# Patient Record
Sex: Male | Born: 1965
Health system: Southern US, Community
[De-identification: ages and names within clinical notes are randomized; demographics above are authoritative.]

## PROBLEM LIST (undated history)

## (undated) DIAGNOSIS — R001 Bradycardia, unspecified: Secondary | ICD-10-CM

## (undated) DIAGNOSIS — I10 Essential (primary) hypertension: Secondary | ICD-10-CM

## (undated) DIAGNOSIS — N200 Calculus of kidney: Secondary | ICD-10-CM

## (undated) DIAGNOSIS — M069 Rheumatoid arthritis, unspecified: Secondary | ICD-10-CM

## (undated) DIAGNOSIS — M109 Gout, unspecified: Secondary | ICD-10-CM

## (undated) DIAGNOSIS — M5030 Other cervical disc degeneration, unspecified cervical region: Secondary | ICD-10-CM

## (undated) HISTORY — DX: Gout, unspecified: M10.9

## (undated) HISTORY — DX: Other cervical disc degeneration, unspecified cervical region: M50.30

## (undated) HISTORY — DX: Essential (primary) hypertension: I10

## (undated) HISTORY — DX: Calculus of kidney: N20.0

## (undated) HISTORY — PX: HAND SURGERY: SHX662

## (undated) HISTORY — DX: Rheumatoid arthritis, unspecified: M06.9

---

## 2005-10-10 ENCOUNTER — Ambulatory Visit: Payer: Self-pay | Admitting: Cardiology

## 2012-10-09 HISTORY — PX: LITHOTRIPSY: SUR834

## 2014-02-08 DIAGNOSIS — Q619 Cystic kidney disease, unspecified: Secondary | ICD-10-CM | POA: Diagnosis not present

## 2014-02-08 DIAGNOSIS — N200 Calculus of kidney: Secondary | ICD-10-CM | POA: Diagnosis not present

## 2014-03-23 DIAGNOSIS — M069 Rheumatoid arthritis, unspecified: Secondary | ICD-10-CM | POA: Diagnosis not present

## 2014-03-23 DIAGNOSIS — M109 Gout, unspecified: Secondary | ICD-10-CM | POA: Diagnosis not present

## 2014-03-23 DIAGNOSIS — M25469 Effusion, unspecified knee: Secondary | ICD-10-CM | POA: Diagnosis not present

## 2014-04-21 DIAGNOSIS — M069 Rheumatoid arthritis, unspecified: Secondary | ICD-10-CM | POA: Diagnosis not present

## 2014-04-21 DIAGNOSIS — M25469 Effusion, unspecified knee: Secondary | ICD-10-CM | POA: Diagnosis not present

## 2014-04-21 DIAGNOSIS — M109 Gout, unspecified: Secondary | ICD-10-CM | POA: Diagnosis not present

## 2014-05-03 DIAGNOSIS — M25469 Effusion, unspecified knee: Secondary | ICD-10-CM | POA: Diagnosis not present

## 2014-05-03 DIAGNOSIS — M1710 Unilateral primary osteoarthritis, unspecified knee: Secondary | ICD-10-CM | POA: Diagnosis not present

## 2014-05-03 DIAGNOSIS — M069 Rheumatoid arthritis, unspecified: Secondary | ICD-10-CM | POA: Diagnosis not present

## 2014-05-03 DIAGNOSIS — M109 Gout, unspecified: Secondary | ICD-10-CM | POA: Diagnosis not present

## 2014-05-10 DIAGNOSIS — M1710 Unilateral primary osteoarthritis, unspecified knee: Secondary | ICD-10-CM | POA: Diagnosis not present

## 2014-05-10 DIAGNOSIS — M109 Gout, unspecified: Secondary | ICD-10-CM | POA: Diagnosis not present

## 2014-05-18 DIAGNOSIS — I10 Essential (primary) hypertension: Secondary | ICD-10-CM | POA: Diagnosis not present

## 2014-05-18 DIAGNOSIS — M1711 Unilateral primary osteoarthritis, right knee: Secondary | ICD-10-CM | POA: Diagnosis not present

## 2014-05-18 DIAGNOSIS — E780 Pure hypercholesterolemia: Secondary | ICD-10-CM | POA: Diagnosis not present

## 2014-05-24 DIAGNOSIS — Z23 Encounter for immunization: Secondary | ICD-10-CM | POA: Diagnosis not present

## 2014-05-24 DIAGNOSIS — G43909 Migraine, unspecified, not intractable, without status migrainosus: Secondary | ICD-10-CM | POA: Diagnosis not present

## 2014-05-24 DIAGNOSIS — E780 Pure hypercholesterolemia: Secondary | ICD-10-CM | POA: Diagnosis not present

## 2014-05-24 DIAGNOSIS — M1000 Idiopathic gout, unspecified site: Secondary | ICD-10-CM | POA: Diagnosis not present

## 2014-05-24 DIAGNOSIS — I10 Essential (primary) hypertension: Secondary | ICD-10-CM | POA: Diagnosis not present

## 2014-05-24 DIAGNOSIS — M0540 Rheumatoid myopathy with rheumatoid arthritis of unspecified site: Secondary | ICD-10-CM | POA: Diagnosis not present

## 2014-11-06 DIAGNOSIS — I10 Essential (primary) hypertension: Secondary | ICD-10-CM | POA: Diagnosis not present

## 2014-11-06 DIAGNOSIS — E780 Pure hypercholesterolemia: Secondary | ICD-10-CM | POA: Diagnosis not present

## 2014-11-13 DIAGNOSIS — Z1389 Encounter for screening for other disorder: Secondary | ICD-10-CM | POA: Diagnosis not present

## 2014-11-13 DIAGNOSIS — M0540 Rheumatoid myopathy with rheumatoid arthritis of unspecified site: Secondary | ICD-10-CM | POA: Diagnosis not present

## 2014-11-13 DIAGNOSIS — E780 Pure hypercholesterolemia: Secondary | ICD-10-CM | POA: Diagnosis not present

## 2014-11-13 DIAGNOSIS — I10 Essential (primary) hypertension: Secondary | ICD-10-CM | POA: Diagnosis not present

## 2014-11-13 DIAGNOSIS — G43909 Migraine, unspecified, not intractable, without status migrainosus: Secondary | ICD-10-CM | POA: Diagnosis not present

## 2014-11-13 DIAGNOSIS — M1000 Idiopathic gout, unspecified site: Secondary | ICD-10-CM | POA: Diagnosis not present

## 2015-02-22 DIAGNOSIS — Q6100 Congenital renal cyst, unspecified: Secondary | ICD-10-CM | POA: Diagnosis not present

## 2015-02-22 DIAGNOSIS — N281 Cyst of kidney, acquired: Secondary | ICD-10-CM | POA: Diagnosis not present

## 2015-02-22 DIAGNOSIS — M069 Rheumatoid arthritis, unspecified: Secondary | ICD-10-CM | POA: Diagnosis not present

## 2015-02-22 DIAGNOSIS — N200 Calculus of kidney: Secondary | ICD-10-CM | POA: Diagnosis not present

## 2015-02-22 DIAGNOSIS — I10 Essential (primary) hypertension: Secondary | ICD-10-CM | POA: Diagnosis not present

## 2015-04-24 DIAGNOSIS — I10 Essential (primary) hypertension: Secondary | ICD-10-CM | POA: Diagnosis not present

## 2015-04-24 DIAGNOSIS — M1000 Idiopathic gout, unspecified site: Secondary | ICD-10-CM | POA: Diagnosis not present

## 2015-04-24 DIAGNOSIS — M0540 Rheumatoid myopathy with rheumatoid arthritis of unspecified site: Secondary | ICD-10-CM | POA: Diagnosis not present

## 2015-05-08 DIAGNOSIS — M7989 Other specified soft tissue disorders: Secondary | ICD-10-CM | POA: Diagnosis not present

## 2015-05-08 DIAGNOSIS — M1000 Idiopathic gout, unspecified site: Secondary | ICD-10-CM | POA: Diagnosis not present

## 2015-05-08 DIAGNOSIS — M0589 Other rheumatoid arthritis with rheumatoid factor of multiple sites: Secondary | ICD-10-CM | POA: Diagnosis not present

## 2015-05-25 DIAGNOSIS — I10 Essential (primary) hypertension: Secondary | ICD-10-CM | POA: Diagnosis not present

## 2015-05-25 DIAGNOSIS — M0540 Rheumatoid myopathy with rheumatoid arthritis of unspecified site: Secondary | ICD-10-CM | POA: Diagnosis not present

## 2015-05-25 DIAGNOSIS — Z13220 Encounter for screening for lipoid disorders: Secondary | ICD-10-CM | POA: Diagnosis not present

## 2015-05-25 DIAGNOSIS — M25532 Pain in left wrist: Secondary | ICD-10-CM | POA: Diagnosis not present

## 2015-05-25 DIAGNOSIS — M1000 Idiopathic gout, unspecified site: Secondary | ICD-10-CM | POA: Diagnosis not present

## 2015-05-25 DIAGNOSIS — G43909 Migraine, unspecified, not intractable, without status migrainosus: Secondary | ICD-10-CM | POA: Diagnosis not present

## 2015-10-25 DIAGNOSIS — M170 Bilateral primary osteoarthritis of knee: Secondary | ICD-10-CM | POA: Diagnosis not present

## 2015-11-01 DIAGNOSIS — M170 Bilateral primary osteoarthritis of knee: Secondary | ICD-10-CM | POA: Diagnosis not present

## 2015-11-08 DIAGNOSIS — M170 Bilateral primary osteoarthritis of knee: Secondary | ICD-10-CM | POA: Diagnosis not present

## 2015-11-08 DIAGNOSIS — M0540 Rheumatoid myopathy with rheumatoid arthritis of unspecified site: Secondary | ICD-10-CM | POA: Diagnosis not present

## 2015-11-08 DIAGNOSIS — I10 Essential (primary) hypertension: Secondary | ICD-10-CM | POA: Diagnosis not present

## 2015-11-08 DIAGNOSIS — M25461 Effusion, right knee: Secondary | ICD-10-CM | POA: Diagnosis not present

## 2015-11-12 DIAGNOSIS — Z13220 Encounter for screening for lipoid disorders: Secondary | ICD-10-CM | POA: Diagnosis not present

## 2015-11-12 DIAGNOSIS — I10 Essential (primary) hypertension: Secondary | ICD-10-CM | POA: Diagnosis not present

## 2015-11-12 DIAGNOSIS — M1000 Idiopathic gout, unspecified site: Secondary | ICD-10-CM | POA: Diagnosis not present

## 2015-11-12 DIAGNOSIS — M0540 Rheumatoid myopathy with rheumatoid arthritis of unspecified site: Secondary | ICD-10-CM | POA: Diagnosis not present

## 2015-11-12 DIAGNOSIS — G43909 Migraine, unspecified, not intractable, without status migrainosus: Secondary | ICD-10-CM | POA: Diagnosis not present

## 2015-12-03 DIAGNOSIS — M25462 Effusion, left knee: Secondary | ICD-10-CM | POA: Diagnosis not present

## 2015-12-03 DIAGNOSIS — M0540 Rheumatoid myopathy with rheumatoid arthritis of unspecified site: Secondary | ICD-10-CM | POA: Diagnosis not present

## 2015-12-03 DIAGNOSIS — M170 Bilateral primary osteoarthritis of knee: Secondary | ICD-10-CM | POA: Diagnosis not present

## 2015-12-12 DIAGNOSIS — S83209A Unspecified tear of unspecified meniscus, current injury, unspecified knee, initial encounter: Secondary | ICD-10-CM | POA: Diagnosis not present

## 2015-12-12 DIAGNOSIS — M25562 Pain in left knee: Secondary | ICD-10-CM | POA: Diagnosis not present

## 2015-12-12 DIAGNOSIS — M069 Rheumatoid arthritis, unspecified: Secondary | ICD-10-CM | POA: Diagnosis not present

## 2015-12-12 DIAGNOSIS — M25561 Pain in right knee: Secondary | ICD-10-CM | POA: Diagnosis not present

## 2015-12-17 DIAGNOSIS — M76892 Other specified enthesopathies of left lower limb, excluding foot: Secondary | ICD-10-CM | POA: Diagnosis not present

## 2015-12-17 DIAGNOSIS — M25561 Pain in right knee: Secondary | ICD-10-CM | POA: Diagnosis not present

## 2015-12-17 DIAGNOSIS — S83209A Unspecified tear of unspecified meniscus, current injury, unspecified knee, initial encounter: Secondary | ICD-10-CM | POA: Diagnosis not present

## 2015-12-17 DIAGNOSIS — M76891 Other specified enthesopathies of right lower limb, excluding foot: Secondary | ICD-10-CM | POA: Diagnosis not present

## 2015-12-17 DIAGNOSIS — M94261 Chondromalacia, right knee: Secondary | ICD-10-CM | POA: Diagnosis not present

## 2015-12-17 DIAGNOSIS — S76112A Strain of left quadriceps muscle, fascia and tendon, initial encounter: Secondary | ICD-10-CM | POA: Diagnosis not present

## 2015-12-20 DIAGNOSIS — M069 Rheumatoid arthritis, unspecified: Secondary | ICD-10-CM | POA: Diagnosis not present

## 2015-12-20 DIAGNOSIS — M25562 Pain in left knee: Secondary | ICD-10-CM | POA: Diagnosis not present

## 2015-12-20 DIAGNOSIS — M25561 Pain in right knee: Secondary | ICD-10-CM | POA: Diagnosis not present

## 2015-12-20 DIAGNOSIS — M1A0620 Idiopathic chronic gout, left knee, without tophus (tophi): Secondary | ICD-10-CM | POA: Diagnosis not present

## 2016-01-09 DIAGNOSIS — R197 Diarrhea, unspecified: Secondary | ICD-10-CM | POA: Diagnosis not present

## 2016-01-12 DIAGNOSIS — R197 Diarrhea, unspecified: Secondary | ICD-10-CM | POA: Diagnosis not present

## 2016-01-15 DIAGNOSIS — M7632 Iliotibial band syndrome, left leg: Secondary | ICD-10-CM | POA: Diagnosis not present

## 2016-01-15 DIAGNOSIS — M25462 Effusion, left knee: Secondary | ICD-10-CM | POA: Diagnosis not present

## 2016-01-15 DIAGNOSIS — M0540 Rheumatoid myopathy with rheumatoid arthritis of unspecified site: Secondary | ICD-10-CM | POA: Diagnosis not present

## 2016-01-16 DIAGNOSIS — A020 Salmonella enteritis: Secondary | ICD-10-CM | POA: Diagnosis not present

## 2016-01-16 DIAGNOSIS — M069 Rheumatoid arthritis, unspecified: Secondary | ICD-10-CM | POA: Diagnosis not present

## 2016-01-17 DIAGNOSIS — A020 Salmonella enteritis: Secondary | ICD-10-CM | POA: Diagnosis not present

## 2016-01-17 DIAGNOSIS — M069 Rheumatoid arthritis, unspecified: Secondary | ICD-10-CM | POA: Diagnosis not present

## 2016-01-18 DIAGNOSIS — M069 Rheumatoid arthritis, unspecified: Secondary | ICD-10-CM | POA: Diagnosis not present

## 2016-01-18 DIAGNOSIS — A020 Salmonella enteritis: Secondary | ICD-10-CM | POA: Diagnosis not present

## 2016-01-30 DIAGNOSIS — M150 Primary generalized (osteo)arthritis: Secondary | ICD-10-CM | POA: Diagnosis not present

## 2016-01-30 DIAGNOSIS — R768 Other specified abnormal immunological findings in serum: Secondary | ICD-10-CM | POA: Diagnosis not present

## 2016-01-30 DIAGNOSIS — M1A09X0 Idiopathic chronic gout, multiple sites, without tophus (tophi): Secondary | ICD-10-CM | POA: Diagnosis not present

## 2016-01-30 DIAGNOSIS — M2550 Pain in unspecified joint: Secondary | ICD-10-CM | POA: Diagnosis not present

## 2016-02-17 DIAGNOSIS — A020 Salmonella enteritis: Secondary | ICD-10-CM | POA: Diagnosis not present

## 2016-02-17 DIAGNOSIS — I10 Essential (primary) hypertension: Secondary | ICD-10-CM | POA: Diagnosis not present

## 2016-02-17 DIAGNOSIS — M170 Bilateral primary osteoarthritis of knee: Secondary | ICD-10-CM | POA: Diagnosis not present

## 2016-02-17 DIAGNOSIS — M1000 Idiopathic gout, unspecified site: Secondary | ICD-10-CM | POA: Diagnosis not present

## 2016-02-17 DIAGNOSIS — M0540 Rheumatoid myopathy with rheumatoid arthritis of unspecified site: Secondary | ICD-10-CM | POA: Diagnosis not present

## 2016-02-21 DIAGNOSIS — N200 Calculus of kidney: Secondary | ICD-10-CM | POA: Diagnosis not present

## 2016-02-21 DIAGNOSIS — N281 Cyst of kidney, acquired: Secondary | ICD-10-CM | POA: Diagnosis not present

## 2016-03-12 DIAGNOSIS — M2550 Pain in unspecified joint: Secondary | ICD-10-CM | POA: Diagnosis not present

## 2016-03-12 DIAGNOSIS — M1A09X0 Idiopathic chronic gout, multiple sites, without tophus (tophi): Secondary | ICD-10-CM | POA: Diagnosis not present

## 2016-03-12 DIAGNOSIS — M150 Primary generalized (osteo)arthritis: Secondary | ICD-10-CM | POA: Diagnosis not present

## 2016-03-12 DIAGNOSIS — R768 Other specified abnormal immunological findings in serum: Secondary | ICD-10-CM | POA: Diagnosis not present

## 2016-08-15 DIAGNOSIS — M1A09X0 Idiopathic chronic gout, multiple sites, without tophus (tophi): Secondary | ICD-10-CM | POA: Diagnosis not present

## 2016-08-15 DIAGNOSIS — M150 Primary generalized (osteo)arthritis: Secondary | ICD-10-CM | POA: Diagnosis not present

## 2016-08-15 DIAGNOSIS — E669 Obesity, unspecified: Secondary | ICD-10-CM | POA: Diagnosis not present

## 2016-08-15 DIAGNOSIS — M2550 Pain in unspecified joint: Secondary | ICD-10-CM | POA: Diagnosis not present

## 2016-08-15 DIAGNOSIS — R768 Other specified abnormal immunological findings in serum: Secondary | ICD-10-CM | POA: Diagnosis not present

## 2016-08-15 DIAGNOSIS — Z6830 Body mass index (BMI) 30.0-30.9, adult: Secondary | ICD-10-CM | POA: Diagnosis not present

## 2016-10-03 DIAGNOSIS — M0540 Rheumatoid myopathy with rheumatoid arthritis of unspecified site: Secondary | ICD-10-CM | POA: Diagnosis not present

## 2016-10-03 DIAGNOSIS — M1000 Idiopathic gout, unspecified site: Secondary | ICD-10-CM | POA: Diagnosis not present

## 2016-10-03 DIAGNOSIS — M170 Bilateral primary osteoarthritis of knee: Secondary | ICD-10-CM | POA: Diagnosis not present

## 2016-10-03 DIAGNOSIS — M25462 Effusion, left knee: Secondary | ICD-10-CM | POA: Diagnosis not present

## 2017-02-12 DIAGNOSIS — E669 Obesity, unspecified: Secondary | ICD-10-CM | POA: Diagnosis not present

## 2017-02-12 DIAGNOSIS — Z6830 Body mass index (BMI) 30.0-30.9, adult: Secondary | ICD-10-CM | POA: Diagnosis not present

## 2017-02-12 DIAGNOSIS — M2550 Pain in unspecified joint: Secondary | ICD-10-CM | POA: Diagnosis not present

## 2017-02-12 DIAGNOSIS — M79641 Pain in right hand: Secondary | ICD-10-CM | POA: Diagnosis not present

## 2017-02-12 DIAGNOSIS — M1A09X0 Idiopathic chronic gout, multiple sites, without tophus (tophi): Secondary | ICD-10-CM | POA: Diagnosis not present

## 2017-02-12 DIAGNOSIS — M79642 Pain in left hand: Secondary | ICD-10-CM | POA: Diagnosis not present

## 2017-02-12 DIAGNOSIS — R5382 Chronic fatigue, unspecified: Secondary | ICD-10-CM | POA: Diagnosis not present

## 2017-02-12 DIAGNOSIS — M150 Primary generalized (osteo)arthritis: Secondary | ICD-10-CM | POA: Diagnosis not present

## 2017-02-12 DIAGNOSIS — R768 Other specified abnormal immunological findings in serum: Secondary | ICD-10-CM | POA: Diagnosis not present

## 2017-05-14 DIAGNOSIS — E669 Obesity, unspecified: Secondary | ICD-10-CM | POA: Diagnosis not present

## 2017-05-14 DIAGNOSIS — M150 Primary generalized (osteo)arthritis: Secondary | ICD-10-CM | POA: Diagnosis not present

## 2017-05-14 DIAGNOSIS — Z6831 Body mass index (BMI) 31.0-31.9, adult: Secondary | ICD-10-CM | POA: Diagnosis not present

## 2017-05-14 DIAGNOSIS — R5382 Chronic fatigue, unspecified: Secondary | ICD-10-CM | POA: Diagnosis not present

## 2017-05-14 DIAGNOSIS — M1A09X0 Idiopathic chronic gout, multiple sites, without tophus (tophi): Secondary | ICD-10-CM | POA: Diagnosis not present

## 2017-05-14 DIAGNOSIS — M0579 Rheumatoid arthritis with rheumatoid factor of multiple sites without organ or systems involvement: Secondary | ICD-10-CM | POA: Diagnosis not present

## 2017-05-14 DIAGNOSIS — M2550 Pain in unspecified joint: Secondary | ICD-10-CM | POA: Diagnosis not present

## 2017-06-08 DIAGNOSIS — M2550 Pain in unspecified joint: Secondary | ICD-10-CM | POA: Diagnosis not present

## 2017-06-08 DIAGNOSIS — M0579 Rheumatoid arthritis with rheumatoid factor of multiple sites without organ or systems involvement: Secondary | ICD-10-CM | POA: Diagnosis not present

## 2017-06-08 DIAGNOSIS — R5382 Chronic fatigue, unspecified: Secondary | ICD-10-CM | POA: Diagnosis not present

## 2017-06-08 DIAGNOSIS — E669 Obesity, unspecified: Secondary | ICD-10-CM | POA: Diagnosis not present

## 2017-06-08 DIAGNOSIS — Z6830 Body mass index (BMI) 30.0-30.9, adult: Secondary | ICD-10-CM | POA: Diagnosis not present

## 2017-06-08 DIAGNOSIS — M1A09X0 Idiopathic chronic gout, multiple sites, without tophus (tophi): Secondary | ICD-10-CM | POA: Diagnosis not present

## 2017-06-08 DIAGNOSIS — M150 Primary generalized (osteo)arthritis: Secondary | ICD-10-CM | POA: Diagnosis not present

## 2017-06-17 DIAGNOSIS — I10 Essential (primary) hypertension: Secondary | ICD-10-CM | POA: Diagnosis not present

## 2017-06-17 DIAGNOSIS — Z0001 Encounter for general adult medical examination with abnormal findings: Secondary | ICD-10-CM | POA: Diagnosis not present

## 2017-06-17 DIAGNOSIS — Z1389 Encounter for screening for other disorder: Secondary | ICD-10-CM | POA: Diagnosis not present

## 2017-06-17 DIAGNOSIS — M170 Bilateral primary osteoarthritis of knee: Secondary | ICD-10-CM | POA: Diagnosis not present

## 2017-06-17 DIAGNOSIS — M0540 Rheumatoid myopathy with rheumatoid arthritis of unspecified site: Secondary | ICD-10-CM | POA: Diagnosis not present

## 2017-06-17 DIAGNOSIS — Z23 Encounter for immunization: Secondary | ICD-10-CM | POA: Diagnosis not present

## 2017-06-17 DIAGNOSIS — M109 Gout, unspecified: Secondary | ICD-10-CM | POA: Diagnosis not present

## 2017-06-17 DIAGNOSIS — Z6830 Body mass index (BMI) 30.0-30.9, adult: Secondary | ICD-10-CM | POA: Diagnosis not present

## 2017-06-17 DIAGNOSIS — M1000 Idiopathic gout, unspecified site: Secondary | ICD-10-CM | POA: Diagnosis not present

## 2017-09-01 DIAGNOSIS — M1A09X0 Idiopathic chronic gout, multiple sites, without tophus (tophi): Secondary | ICD-10-CM | POA: Diagnosis not present

## 2017-09-01 DIAGNOSIS — E669 Obesity, unspecified: Secondary | ICD-10-CM | POA: Diagnosis not present

## 2017-09-01 DIAGNOSIS — M150 Primary generalized (osteo)arthritis: Secondary | ICD-10-CM | POA: Diagnosis not present

## 2017-09-01 DIAGNOSIS — M0579 Rheumatoid arthritis with rheumatoid factor of multiple sites without organ or systems involvement: Secondary | ICD-10-CM | POA: Diagnosis not present

## 2017-09-01 DIAGNOSIS — R5382 Chronic fatigue, unspecified: Secondary | ICD-10-CM | POA: Diagnosis not present

## 2017-09-01 DIAGNOSIS — M2550 Pain in unspecified joint: Secondary | ICD-10-CM | POA: Diagnosis not present

## 2017-09-01 DIAGNOSIS — Z6830 Body mass index (BMI) 30.0-30.9, adult: Secondary | ICD-10-CM | POA: Diagnosis not present

## 2017-09-10 DIAGNOSIS — N179 Acute kidney failure, unspecified: Secondary | ICD-10-CM | POA: Diagnosis not present

## 2017-09-10 DIAGNOSIS — N281 Cyst of kidney, acquired: Secondary | ICD-10-CM | POA: Diagnosis not present

## 2017-09-10 DIAGNOSIS — N400 Enlarged prostate without lower urinary tract symptoms: Secondary | ICD-10-CM | POA: Diagnosis not present

## 2017-09-10 DIAGNOSIS — N200 Calculus of kidney: Secondary | ICD-10-CM | POA: Diagnosis not present

## 2017-11-26 DIAGNOSIS — M0579 Rheumatoid arthritis with rheumatoid factor of multiple sites without organ or systems involvement: Secondary | ICD-10-CM | POA: Diagnosis not present

## 2017-11-26 DIAGNOSIS — M2550 Pain in unspecified joint: Secondary | ICD-10-CM | POA: Diagnosis not present

## 2017-11-26 DIAGNOSIS — E669 Obesity, unspecified: Secondary | ICD-10-CM | POA: Diagnosis not present

## 2017-11-26 DIAGNOSIS — M150 Primary generalized (osteo)arthritis: Secondary | ICD-10-CM | POA: Diagnosis not present

## 2017-11-26 DIAGNOSIS — R5382 Chronic fatigue, unspecified: Secondary | ICD-10-CM | POA: Diagnosis not present

## 2017-11-26 DIAGNOSIS — M1A09X0 Idiopathic chronic gout, multiple sites, without tophus (tophi): Secondary | ICD-10-CM | POA: Diagnosis not present

## 2017-11-26 DIAGNOSIS — Z6830 Body mass index (BMI) 30.0-30.9, adult: Secondary | ICD-10-CM | POA: Diagnosis not present

## 2018-01-25 DIAGNOSIS — M79642 Pain in left hand: Secondary | ICD-10-CM | POA: Diagnosis not present

## 2018-01-25 DIAGNOSIS — M150 Primary generalized (osteo)arthritis: Secondary | ICD-10-CM | POA: Diagnosis not present

## 2018-01-25 DIAGNOSIS — M79641 Pain in right hand: Secondary | ICD-10-CM | POA: Diagnosis not present

## 2018-01-25 DIAGNOSIS — M1A09X0 Idiopathic chronic gout, multiple sites, without tophus (tophi): Secondary | ICD-10-CM | POA: Diagnosis not present

## 2018-01-25 DIAGNOSIS — Z6831 Body mass index (BMI) 31.0-31.9, adult: Secondary | ICD-10-CM | POA: Diagnosis not present

## 2018-01-25 DIAGNOSIS — R5382 Chronic fatigue, unspecified: Secondary | ICD-10-CM | POA: Diagnosis not present

## 2018-01-25 DIAGNOSIS — M0579 Rheumatoid arthritis with rheumatoid factor of multiple sites without organ or systems involvement: Secondary | ICD-10-CM | POA: Diagnosis not present

## 2018-01-25 DIAGNOSIS — E669 Obesity, unspecified: Secondary | ICD-10-CM | POA: Diagnosis not present

## 2018-01-25 DIAGNOSIS — M2550 Pain in unspecified joint: Secondary | ICD-10-CM | POA: Diagnosis not present

## 2018-06-23 DIAGNOSIS — M5031 Other cervical disc degeneration,  high cervical region: Secondary | ICD-10-CM | POA: Diagnosis not present

## 2018-06-23 DIAGNOSIS — M5010 Cervical disc disorder with radiculopathy, unspecified cervical region: Secondary | ICD-10-CM | POA: Diagnosis not present

## 2018-06-23 DIAGNOSIS — M542 Cervicalgia: Secondary | ICD-10-CM | POA: Diagnosis not present

## 2018-12-31 DIAGNOSIS — M1A09X0 Idiopathic chronic gout, multiple sites, without tophus (tophi): Secondary | ICD-10-CM | POA: Diagnosis not present

## 2019-02-01 DIAGNOSIS — R112 Nausea with vomiting, unspecified: Secondary | ICD-10-CM | POA: Diagnosis not present

## 2019-02-01 DIAGNOSIS — R300 Dysuria: Secondary | ICD-10-CM | POA: Diagnosis not present

## 2019-02-01 DIAGNOSIS — M545 Low back pain: Secondary | ICD-10-CM | POA: Diagnosis not present

## 2019-02-01 DIAGNOSIS — N132 Hydronephrosis with renal and ureteral calculous obstruction: Secondary | ICD-10-CM | POA: Diagnosis not present

## 2019-02-01 DIAGNOSIS — R1084 Generalized abdominal pain: Secondary | ICD-10-CM | POA: Diagnosis not present

## 2019-02-02 DIAGNOSIS — N281 Cyst of kidney, acquired: Secondary | ICD-10-CM | POA: Diagnosis not present

## 2019-02-02 DIAGNOSIS — N200 Calculus of kidney: Secondary | ICD-10-CM | POA: Diagnosis not present

## 2019-02-15 DIAGNOSIS — M79642 Pain in left hand: Secondary | ICD-10-CM | POA: Diagnosis not present

## 2019-02-15 DIAGNOSIS — M150 Primary generalized (osteo)arthritis: Secondary | ICD-10-CM | POA: Diagnosis not present

## 2019-02-15 DIAGNOSIS — M79641 Pain in right hand: Secondary | ICD-10-CM | POA: Diagnosis not present

## 2019-02-15 DIAGNOSIS — R5382 Chronic fatigue, unspecified: Secondary | ICD-10-CM | POA: Diagnosis not present

## 2019-02-15 DIAGNOSIS — Z6831 Body mass index (BMI) 31.0-31.9, adult: Secondary | ICD-10-CM | POA: Diagnosis not present

## 2019-02-15 DIAGNOSIS — M2550 Pain in unspecified joint: Secondary | ICD-10-CM | POA: Diagnosis not present

## 2019-02-15 DIAGNOSIS — E669 Obesity, unspecified: Secondary | ICD-10-CM | POA: Diagnosis not present

## 2019-02-15 DIAGNOSIS — M1A09X0 Idiopathic chronic gout, multiple sites, without tophus (tophi): Secondary | ICD-10-CM | POA: Diagnosis not present

## 2019-02-15 DIAGNOSIS — M0579 Rheumatoid arthritis with rheumatoid factor of multiple sites without organ or systems involvement: Secondary | ICD-10-CM | POA: Diagnosis not present

## 2019-03-17 DIAGNOSIS — N281 Cyst of kidney, acquired: Secondary | ICD-10-CM | POA: Diagnosis not present

## 2019-03-17 DIAGNOSIS — N200 Calculus of kidney: Secondary | ICD-10-CM | POA: Diagnosis not present

## 2019-06-20 DIAGNOSIS — E669 Obesity, unspecified: Secondary | ICD-10-CM | POA: Diagnosis not present

## 2019-06-20 DIAGNOSIS — M1A09X0 Idiopathic chronic gout, multiple sites, without tophus (tophi): Secondary | ICD-10-CM | POA: Diagnosis not present

## 2019-06-20 DIAGNOSIS — M79641 Pain in right hand: Secondary | ICD-10-CM | POA: Diagnosis not present

## 2019-06-20 DIAGNOSIS — M2550 Pain in unspecified joint: Secondary | ICD-10-CM | POA: Diagnosis not present

## 2019-06-20 DIAGNOSIS — M0579 Rheumatoid arthritis with rheumatoid factor of multiple sites without organ or systems involvement: Secondary | ICD-10-CM | POA: Diagnosis not present

## 2019-06-20 DIAGNOSIS — Z6831 Body mass index (BMI) 31.0-31.9, adult: Secondary | ICD-10-CM | POA: Diagnosis not present

## 2019-06-20 DIAGNOSIS — M79642 Pain in left hand: Secondary | ICD-10-CM | POA: Diagnosis not present

## 2019-06-20 DIAGNOSIS — M150 Primary generalized (osteo)arthritis: Secondary | ICD-10-CM | POA: Diagnosis not present

## 2019-06-20 DIAGNOSIS — R5382 Chronic fatigue, unspecified: Secondary | ICD-10-CM | POA: Diagnosis not present

## 2019-06-27 DIAGNOSIS — I10 Essential (primary) hypertension: Secondary | ICD-10-CM | POA: Diagnosis not present

## 2019-06-27 DIAGNOSIS — M109 Gout, unspecified: Secondary | ICD-10-CM | POA: Diagnosis not present

## 2019-06-27 DIAGNOSIS — M170 Bilateral primary osteoarthritis of knee: Secondary | ICD-10-CM | POA: Diagnosis not present

## 2019-06-27 DIAGNOSIS — E7800 Pure hypercholesterolemia, unspecified: Secondary | ICD-10-CM | POA: Diagnosis not present

## 2019-06-27 DIAGNOSIS — M0540 Rheumatoid myopathy with rheumatoid arthritis of unspecified site: Secondary | ICD-10-CM | POA: Diagnosis not present

## 2019-07-28 ENCOUNTER — Encounter: Payer: Self-pay | Admitting: *Deleted

## 2019-08-01 ENCOUNTER — Other Ambulatory Visit: Payer: Self-pay

## 2019-08-01 ENCOUNTER — Telehealth: Payer: Self-pay | Admitting: Cardiovascular Disease

## 2019-08-01 ENCOUNTER — Ambulatory Visit (INDEPENDENT_AMBULATORY_CARE_PROVIDER_SITE_OTHER): Payer: 59 | Admitting: Cardiovascular Disease

## 2019-08-01 ENCOUNTER — Encounter: Payer: Self-pay | Admitting: Cardiovascular Disease

## 2019-08-01 VITALS — BP 138/98 | HR 52 | Ht 72.0 in | Wt 224.0 lb

## 2019-08-01 DIAGNOSIS — R072 Precordial pain: Secondary | ICD-10-CM

## 2019-08-01 DIAGNOSIS — R55 Syncope and collapse: Secondary | ICD-10-CM

## 2019-08-01 DIAGNOSIS — Z01818 Encounter for other preprocedural examination: Secondary | ICD-10-CM | POA: Diagnosis not present

## 2019-08-01 DIAGNOSIS — R001 Bradycardia, unspecified: Secondary | ICD-10-CM

## 2019-08-01 DIAGNOSIS — R079 Chest pain, unspecified: Secondary | ICD-10-CM

## 2019-08-01 MED ORDER — METOPROLOL TARTRATE 100 MG PO TABS: ORAL_TABLET | ORAL | 0 refills | Status: DC

## 2019-08-01 NOTE — Telephone Encounter (Signed)
Pre-cert Verification for the following procedure    30 day event monitor

## 2019-08-01 NOTE — Addendum Note (Signed)
Addended by: Barbarann Ehlers A on: 08/01/2019 11:46 AM   Modules accepted: Orders

## 2019-08-01 NOTE — Progress Notes (Signed)
CARDIOLOGY CONSULT NOTE  Patient ID: Jerry Page MRN: [id number] DOB/AGE: 01-14-1966 53 y.o.  Admit date: (Not on file) Primary Physician: Lovey Newcomer, PA  Reason for Consultation: Bradycardia  HPI: Jerry Page is a 53 y.o. male who is being seen today for the evaluation of bradycardia at the request of Lovey Newcomer, [place].   I reviewed notes from his PCP dated 06/30/2019.  His heart rate was 55 bpm at their office on that day.  He was noted to have a heart rate between 52 to 55 bpm during that physical exam.  Previous heart rates have been in the mid 70-80 bpm range.  He was previously on a beta-blocker.  I personally reviewed an ECG performed on 06/30/2019 which demonstrated sinus bradycardia, 43 bpm.  For the past 3 or 4 months he has been experiencing intermittent lightheadedness and dizziness and has almost passed out on several occasions.  He has been having retrosternal chest pains radiating to the right shoulder.  He has experienced exertional dyspnea when walking long distances.  He does snore but denies witnessed apneic episodes by his wife.  He has been debilitated by gout and rheumatoid arthritis for the last several years and was taken out of work about 10 years ago.  He was bedridden for nearly 2 months.  Social history: He has never smoked nor drank alcohol.  Family history: His father underwent bypass surgery at age 73 and was a heavy smoker.  He has 4 paternal uncles all who died of myocardial infarction.   No Known Allergies  Current Outpatient Medications  Medication Sig Dispense Refill  . lisinopril (ZESTRIL) 10 MG tablet Take 10 mg by mouth daily.    . simvastatin (ZOCOR) 40 MG tablet Take 20 mg by mouth daily.     No current facility-administered medications for this visit.    Past Medical History:  Diagnosis Date  . Gout   . Hypertension   . Recurrent nephrolithiasis     Past Surgical History:  Procedure Laterality  Date  . HAND SURGERY Right   . LITHOTRIPSY  10/2012    Social History   Socioeconomic History  . Marital status: Married    Spouse name: Not on file  . Number of children: Not on file  . Years of education: Not on file  . Highest education level: Not on file  Occupational History  . Not on file  Tobacco Use  . Smoking status: Never Smoker  . Smokeless tobacco: Never Used  Substance and Sexual Activity  . Alcohol use: Not on file  . Drug use: Not on file  . Sexual activity: Not on file  Other Topics Concern  . Not on file  Social History Narrative  . Not on file   Social Determinants of Health   Financial Resource Strain:   . Difficulty of Paying Living Expenses: Not on file  Food Insecurity:   . Worried About Programme researcher, broadcasting/film/video in the Last Year: Not on file  . Ran Out of Food in the Last Year: Not on file  Transportation Needs:   . Lack of Transportation (Medical): Not on file  . Lack of Transportation (Non-Medical): Not on file  Physical Activity:   . Days of Exercise per Week: Not on file  . Minutes of Exercise per Session: Not on file  Stress:   . Feeling of Stress : Not on file  Social Connections:   . Frequency of  Communication with Friends and Family: Not on file  . Frequency of Social Gatherings with Friends and Family: Not on file  . Attends Religious Services: Not on file  . Active Member of Clubs or Organizations: Not on file  . Attends Archivist Meetings: Not on file  . Marital Status: Not on file  Intimate Partner Violence:   . Fear of Current or Ex-Partner: Not on file  . Emotionally Abused: Not on file  . Physically Abused: Not on file  . Sexually Abused: Not on file      Current Meds  Medication Sig  . lisinopril (ZESTRIL) 10 MG tablet Take 10 mg by mouth daily.  . simvastatin (ZOCOR) 40 MG tablet Take 20 mg by mouth daily.      Review of systems complete and found to be negative unless listed above in HPI    Physical  exam Blood pressure (!) 138/98, pulse (!) 52, height 6' (1.829 m), weight 224 lb (101.6 kg), SpO2 96 %. General: NAD Neck: No JVD, no thyromegaly or thyroid nodule.  Lungs: Clear to auscultation bilaterally with normal respiratory effort. CV: Nondisplaced PMI. Regular rate and rhythm, normal S1/S2, no S3/S4, no murmur.  No peripheral edema.  No carotid bruit.    Abdomen: Soft, nontender, no distention.  Skin: Intact without lesions or rashes.  Neurologic: Alert and oriented x 3.  Psych: Normal affect. Extremities: No clubbing or cyanosis.  HEENT: Normal.   ECG: Most recent ECG reviewed.   Labs: No results found for: K, BUN, CREATININE, ALT, TSH, HGB   Lipids: No results found for: LDLCALC, LDLDIRECT, CHOL, TRIG, HDL      ASSESSMENT AND PLAN:  1.  Chest pain: He has a strong family history of heart disease and has recently developed bradycardia in the last several months which appears to be symptomatic.  I wonder if he has an RCA lesion.  I will proceed with coronary CT angiography.  2.  Bradycardia and near syncope: He has had symptomatic bradycardia with several episodes of near syncope in the last 3 to 4 months.  Please see discussion #1.  I will obtain a 30-day event monitor.  I will also check to see if a TSH has been checked by his PCP.     Disposition: Follow up in 8-10 weeks virtual visit  Signed: Kate Sable, M.D., F.A.C.C.  08/01/2019, 9:00 AM

## 2019-08-01 NOTE — Patient Instructions (Signed)
Your cardiac CT will be scheduled at one of the below locations:   [facility] [address] [place], [place] [address] [phone] [place] [address] [place], [place] [address] [phone]  If scheduled at [facility], please arrive at the [facility] main entrance of [facility] 30-45 minutes prior to test start time. Proceed to the [facility] Radiology Department (first floor) to check-in and test prep.  If scheduled at [name], please arrive 15 mins early for check-in and test prep.  Please follow these instructions carefully (unless otherwise directed):  Hold all erectile dysfunction medications at least 3 days (72 hrs) prior to test.  On the Night Before the Test: . Be sure to Drink plenty of water. . Do not consume any caffeinated/decaffeinated beverages or chocolate 12 hours prior to your test. . Do not take any antihistamines 12 hours prior to your test. IOn the Day of the Test: . Drink plenty of water. Do not drink any water within one hour of the test. . Do not eat any food 4 hours prior to the test. . You may take your regular medications prior to the test.  . Take metoprolol (Lopressor) two hours prior to test. . HOLD Furosemide/Hydrochlorothiazide morning of the test. .    After the Test: . Drink plenty of water. . After receiving IV contrast, you may experience a mild flushed feeling. This is normal. . On occasion, you may experience a mild rash up to 24 hours after the test. This is not dangerous. If this occurs, you can take Benadryl 25 mg and increase your fluid intake. . If you experience trouble breathing, this can be serious. If it is severe call 911 IMMEDIATELY. If it is mild, please call our office. . If you take any of these medications: Glipizide/Metformin, Avandament, Glucavance, please do not take 48 hours after completing test  unless otherwise instructed.   Once we have confirmed authorization from your insurance company, we will call you to set up a date and time for your test.   For non-scheduling related questions, please contact the cardiac imaging nurse navigator should you have any questions/concerns: Marchia Bond, RN Navigator Cardiac Imaging [facility] [phone] Office        Your physician has recommended that you wear an event monitor. Event monitors are medical devices that record the heart's electrical activity. Doctors most often [place] these monitors to diagnose arrhythmias. Arrhythmias are problems with the speed or rhythm of the heartbeat. The monitor is a small, portable device. You can wear one while you do your normal daily activities. This is usually used to diagnose what is causing palpitations/syncope (passing out).       Follow up in 2-3 months with a Virtual Phone visit with Dr.Koneswaran        Thank you for choosing [facility] !

## 2019-08-10 ENCOUNTER — Telehealth: Payer: Self-pay | Admitting: Cardiovascular Disease

## 2019-08-10 NOTE — Telephone Encounter (Signed)
Patient called stating that he has not heard from the heart monitor.

## 2019-08-15 NOTE — Telephone Encounter (Signed)
Patient contacted to see if monitor company has contacted him yet. Reports no contact at this time as of yet Advised that enrollment process would be done today for monitor. Verbalized understanding.

## 2019-08-15 NOTE — Telephone Encounter (Signed)
Per Cathy-patient was enrolled with [employer] last week.  [employer] contacted to cancel enrollment

## 2019-08-16 ENCOUNTER — Ambulatory Visit (INDEPENDENT_AMBULATORY_CARE_PROVIDER_SITE_OTHER): Payer: 59

## 2019-08-16 DIAGNOSIS — R55 Syncope and collapse: Secondary | ICD-10-CM

## 2019-08-16 DIAGNOSIS — R001 Bradycardia, unspecified: Secondary | ICD-10-CM | POA: Diagnosis not present

## 2019-09-06 DIAGNOSIS — R55 Syncope and collapse: Secondary | ICD-10-CM | POA: Diagnosis not present

## 2019-09-06 DIAGNOSIS — R001 Bradycardia, unspecified: Secondary | ICD-10-CM | POA: Diagnosis not present

## 2019-09-06 DIAGNOSIS — Z01818 Encounter for other preprocedural examination: Secondary | ICD-10-CM | POA: Diagnosis not present

## 2019-09-06 DIAGNOSIS — R079 Chest pain, unspecified: Secondary | ICD-10-CM | POA: Diagnosis not present

## 2019-09-13 ENCOUNTER — Telehealth (HOSPITAL_COMMUNITY): Payer: Self-pay | Admitting: Emergency Medicine

## 2019-09-13 NOTE — Telephone Encounter (Signed)
Left message on voicemail with name and callback number Falon Huesca RN Navigator Cardiac Imaging  [facility] [phone] Office [phone] Cell  

## 2019-09-14 ENCOUNTER — Other Ambulatory Visit: Payer: Self-pay

## 2019-09-14 ENCOUNTER — Ambulatory Visit (HOSPITAL_COMMUNITY)
Admission: RE | Admit: 2019-09-14 | Discharge: 2019-09-14 | Disposition: A | Discharge status: Home / Self Care | Payer: 59 | Source: Ambulatory Visit | Attending: Cardiovascular Disease | Admitting: Cardiovascular Disease

## 2019-09-14 DIAGNOSIS — R072 Precordial pain: Secondary | ICD-10-CM | POA: Insufficient documentation

## 2019-09-14 MED ORDER — IOHEXOL 350 MG/ML SOLN
100.0000 mL | Freq: Once | INTRAVENOUS | Status: AC | PRN
  Administered 2019-09-14: 15:00:00 100 mL via INTRAVENOUS

## 2019-09-14 MED ORDER — NITROGLYCERIN 0.4 MG SL SUBL
SUBLINGUAL_TABLET | SUBLINGUAL | Status: AC
  Filled 2019-09-14: qty 2

## 2019-09-14 MED ORDER — NITROGLYCERIN 0.4 MG SL SUBL
0.8000 mg | SUBLINGUAL_TABLET | Freq: Once | SUBLINGUAL | Status: AC
  Administered 2019-09-14: 0.8 mg via SUBLINGUAL

## 2019-09-14 NOTE — Progress Notes (Signed)
CT scan completed. Tolerated well. D/C home walking, awake and alert. In no distress. 

## 2019-09-15 ENCOUNTER — Telehealth: Payer: Self-pay | Admitting: *Deleted

## 2019-09-15 NOTE — Telephone Encounter (Signed)
Jerry Page, [name]  2/0/8138 8:71 PM EST    Left message to return call.

## 2019-09-15 NOTE — Telephone Encounter (Signed)
     Laqueta Linden, MD  09/15/2019 2:46 PM EST    Mild plaque in the LAD. No significant blockages. Continue aggressive risk factor modification/primary prevention.   Laqueta Linden, MD  09/15/2019 9:10 AM EST    No significant extracardiac findings. Await coronary results.

## 2019-09-16 NOTE — Telephone Encounter (Signed)
Jerry Page, [name]  12/10/1019 1:17 AM EST    Patient notified. Copy to pmd. Follow up scheduled for 10/21/2019 with Dr. Purvis Sheffield.

## 2019-09-27 ENCOUNTER — Telehealth: Payer: Self-pay | Admitting: *Deleted

## 2019-09-27 NOTE — Telephone Encounter (Signed)
Lesle Chris, [name]  6/88/6484 72:07 AM EST    Patient notified. Copy to pmd. Follow up scheduled for March with Dr. Purvis Sheffield.    Laqueta Linden, MD  09/25/2019 1:54 PM EST    Occasional extra beats. No significant arrhythmias.

## 2019-10-18 DIAGNOSIS — M1A09X0 Idiopathic chronic gout, multiple sites, without tophus (tophi): Secondary | ICD-10-CM | POA: Diagnosis not present

## 2019-10-18 DIAGNOSIS — Z6831 Body mass index (BMI) 31.0-31.9, adult: Secondary | ICD-10-CM | POA: Diagnosis not present

## 2019-10-18 DIAGNOSIS — M0579 Rheumatoid arthritis with rheumatoid factor of multiple sites without organ or systems involvement: Secondary | ICD-10-CM | POA: Diagnosis not present

## 2019-10-18 DIAGNOSIS — E669 Obesity, unspecified: Secondary | ICD-10-CM | POA: Diagnosis not present

## 2019-10-18 DIAGNOSIS — M2550 Pain in unspecified joint: Secondary | ICD-10-CM | POA: Diagnosis not present

## 2019-10-18 DIAGNOSIS — R5382 Chronic fatigue, unspecified: Secondary | ICD-10-CM | POA: Diagnosis not present

## 2019-10-18 DIAGNOSIS — M150 Primary generalized (osteo)arthritis: Secondary | ICD-10-CM | POA: Diagnosis not present

## 2019-10-21 ENCOUNTER — Encounter: Payer: Self-pay | Admitting: Cardiovascular Disease

## 2019-10-21 ENCOUNTER — Ambulatory Visit (INDEPENDENT_AMBULATORY_CARE_PROVIDER_SITE_OTHER): Payer: 59 | Admitting: Cardiovascular Disease

## 2019-10-21 VITALS — BP 148/100 | HR 52 | Ht 72.0 in | Wt 221.8 lb

## 2019-10-21 DIAGNOSIS — R079 Chest pain, unspecified: Secondary | ICD-10-CM | POA: Diagnosis not present

## 2019-10-21 DIAGNOSIS — R55 Syncope and collapse: Secondary | ICD-10-CM | POA: Diagnosis not present

## 2019-10-21 DIAGNOSIS — R001 Bradycardia, unspecified: Secondary | ICD-10-CM | POA: Diagnosis not present

## 2019-10-21 NOTE — Patient Instructions (Signed)
Medication Instructions:  Continue all current medications.  Labwork: none  Testing/Procedures: none  Follow-Up: As needed.    Any Other Special Instructions Will Be Listed Below (If Applicable).  If you need a refill on your cardiac medications before your next appointment, please call your pharmacy.  

## 2019-10-21 NOTE — Progress Notes (Signed)
SUBJECTIVE: The patient returns for follow-up after undergoing cardiovascular testing performed for the evaluation of chest pain and bradycardia.  He has been debilitated by gout and rheumatoid arthritis for the last several years and was taken out of work about 10 years ago.    The patient denies any symptoms of chest pain, palpitations, shortness of breath, lightheadedness, dizziness, leg swelling, orthopnea, PND, and syncope.  His gout has been flaring lately and he was recently prescribed prednisone by his PCP as well as Uloric.    Review of Systems: As per "subjective", otherwise negative.  No Known Allergies  Current Outpatient Medications  Medication Sig Dispense Refill  . Febuxostat 80 MG TABS Take 1 tablet by mouth daily.    Marland Kitchen lisinopril (ZESTRIL) 10 MG tablet Take 10 mg by mouth daily.    . predniSONE (DELTASONE) 5 MG tablet Take 5 mg by mouth daily.    . simvastatin (ZOCOR) 20 MG tablet Take 20 mg by mouth at bedtime.    . simvastatin (ZOCOR) 40 MG tablet Take 20 mg by mouth daily.     No current facility-administered medications for this visit.    Past Medical History:  Diagnosis Date  . Gout   . Hypertension   . Recurrent nephrolithiasis     Past Surgical History:  Procedure Laterality Date  . HAND SURGERY Right   . LITHOTRIPSY  10/2012    Social History   Socioeconomic History  . Marital status: Married    Spouse name: Not on file  . Number of children: Not on file  . Years of education: Not on file  . Highest education level: Not on file  Occupational History  . Not on file  Tobacco Use  . Smoking status: Never Smoker  . Smokeless tobacco: Never Used  Substance and Sexual Activity  . Alcohol use: Not on file  . Drug use: Not on file  . Sexual activity: Not on file  Other Topics Concern  . Not on file  Social History Narrative  . Not on file   Social Determinants of Health   Financial Resource Strain:   . Difficulty of Paying  Living Expenses:   Food Insecurity:   . Worried About Programme researcher, broadcasting/film/video in the Last Year:   . Barista in the Last Year:   Transportation Needs:   . Freight forwarder (Medical):   Marland Kitchen Lack of Transportation (Non-Medical):   Physical Activity:   . Days of Exercise per Week:   . Minutes of Exercise per Session:   Stress:   . Feeling of Stress :   Social Connections:   . Frequency of Communication with Friends and Family:   . Frequency of Social Gatherings with Friends and Family:   . Attends Religious Services:   . Active Member of Clubs or Organizations:   . Attends Banker Meetings:   Marland Kitchen Marital Status:   Intimate Partner Violence:   . Fear of Current or Ex-Partner:   . Emotionally Abused:   Marland Kitchen Physically Abused:   . Sexually Abused:      Vitals:   10/21/19 1144  BP: (!) 148/100  Pulse: (!) 52  SpO2: 98%  Weight: 221 lb 12.8 oz (100.6 kg)  Height: 6' (1.829 m)    Wt Readings from Last 3 Encounters:  10/21/19 221 lb 12.8 oz (100.6 kg)  08/01/19 224 lb (101.6 kg)     PHYSICAL EXAM General: NAD HEENT: Normal. Neck:  No JVD, no thyromegaly. Lungs: Clear to auscultation bilaterally with normal respiratory effort. CV: Bradycardic, regular rhythm, normal S1/S2, no S3/S4, no murmur. No pretibial or periankle edema.  No carotid bruit.   Abdomen: Soft, nontender, no distention.  Neurologic: Alert and oriented.  Psych: Normal affect. Skin: Normal. Musculoskeletal: No gross deformities.      Labs: No results found for: K, BUN, CREATININE, ALT, TSH, HGB   Lipids: No results found for: LDLCALC, LDLDIRECT, CHOL, TRIG, HDL     ASSESSMENT AND PLAN:  1.  Chest pain: Coronary CT angiography demonstrated mild calcified plaque in the proximal LAD with 25-49% stenosis (mild disease).  Coronary calcium score was 104 and in the 97 percentile.  Symptoms are noncardiac in etiology.  He is on statin therapy.  No symptom recurrence.  2.  Bradycardia and  near syncope: No symptom recurrence.  Event monitoring demonstrated predominantly sinus rhythm with occasional PACs.  There was one isolated 6 beat atrial run.  There were no significant arrhythmias.  There were no pauses.  Average heart rate 65 bpm.  3.  Hypertension: Blood pressure is elevated.  Currently on lisinopril 10 mg daily.  I would recommend increasing this to 20 mg daily if it remains elevated.   Disposition: Follow up as needed   Kate Sable, M.D., F.A.C.C.

## 2020-04-20 DIAGNOSIS — M1A09X0 Idiopathic chronic gout, multiple sites, without tophus (tophi): Secondary | ICD-10-CM | POA: Diagnosis not present

## 2020-04-20 DIAGNOSIS — M2550 Pain in unspecified joint: Secondary | ICD-10-CM | POA: Diagnosis not present

## 2020-04-20 DIAGNOSIS — E669 Obesity, unspecified: Secondary | ICD-10-CM | POA: Diagnosis not present

## 2020-04-20 DIAGNOSIS — M150 Primary generalized (osteo)arthritis: Secondary | ICD-10-CM | POA: Diagnosis not present

## 2020-04-20 DIAGNOSIS — Z6832 Body mass index (BMI) 32.0-32.9, adult: Secondary | ICD-10-CM | POA: Diagnosis not present

## 2020-04-20 DIAGNOSIS — M79641 Pain in right hand: Secondary | ICD-10-CM | POA: Diagnosis not present

## 2020-04-20 DIAGNOSIS — M0579 Rheumatoid arthritis with rheumatoid factor of multiple sites without organ or systems involvement: Secondary | ICD-10-CM | POA: Diagnosis not present

## 2020-07-04 DIAGNOSIS — Z0001 Encounter for general adult medical examination with abnormal findings: Secondary | ICD-10-CM | POA: Diagnosis not present

## 2020-07-04 DIAGNOSIS — M549 Dorsalgia, unspecified: Secondary | ICD-10-CM | POA: Diagnosis not present

## 2020-07-04 DIAGNOSIS — Z23 Encounter for immunization: Secondary | ICD-10-CM | POA: Diagnosis not present

## 2020-07-04 DIAGNOSIS — Z6832 Body mass index (BMI) 32.0-32.9, adult: Secondary | ICD-10-CM | POA: Diagnosis not present

## 2020-07-20 DIAGNOSIS — M0579 Rheumatoid arthritis with rheumatoid factor of multiple sites without organ or systems involvement: Secondary | ICD-10-CM | POA: Diagnosis not present

## 2020-07-20 DIAGNOSIS — Z6831 Body mass index (BMI) 31.0-31.9, adult: Secondary | ICD-10-CM | POA: Diagnosis not present

## 2020-07-20 DIAGNOSIS — E669 Obesity, unspecified: Secondary | ICD-10-CM | POA: Diagnosis not present

## 2020-07-20 DIAGNOSIS — M2550 Pain in unspecified joint: Secondary | ICD-10-CM | POA: Diagnosis not present

## 2020-07-20 DIAGNOSIS — M1A09X0 Idiopathic chronic gout, multiple sites, without tophus (tophi): Secondary | ICD-10-CM | POA: Diagnosis not present

## 2020-07-20 DIAGNOSIS — M79641 Pain in right hand: Secondary | ICD-10-CM | POA: Diagnosis not present

## 2020-07-20 DIAGNOSIS — M150 Primary generalized (osteo)arthritis: Secondary | ICD-10-CM | POA: Diagnosis not present

## 2020-10-31 DIAGNOSIS — E669 Obesity, unspecified: Secondary | ICD-10-CM | POA: Diagnosis not present

## 2020-10-31 DIAGNOSIS — Z111 Encounter for screening for respiratory tuberculosis: Secondary | ICD-10-CM | POA: Diagnosis not present

## 2020-10-31 DIAGNOSIS — M150 Primary generalized (osteo)arthritis: Secondary | ICD-10-CM | POA: Diagnosis not present

## 2020-10-31 DIAGNOSIS — M1A09X0 Idiopathic chronic gout, multiple sites, without tophus (tophi): Secondary | ICD-10-CM | POA: Diagnosis not present

## 2020-10-31 DIAGNOSIS — M2550 Pain in unspecified joint: Secondary | ICD-10-CM | POA: Diagnosis not present

## 2020-10-31 DIAGNOSIS — Z6831 Body mass index (BMI) 31.0-31.9, adult: Secondary | ICD-10-CM | POA: Diagnosis not present

## 2020-10-31 DIAGNOSIS — M0579 Rheumatoid arthritis with rheumatoid factor of multiple sites without organ or systems involvement: Secondary | ICD-10-CM | POA: Diagnosis not present

## 2020-11-01 DIAGNOSIS — N281 Cyst of kidney, acquired: Secondary | ICD-10-CM | POA: Diagnosis not present

## 2020-11-01 DIAGNOSIS — R161 Splenomegaly, not elsewhere classified: Secondary | ICD-10-CM | POA: Diagnosis not present

## 2020-11-01 DIAGNOSIS — N400 Enlarged prostate without lower urinary tract symptoms: Secondary | ICD-10-CM | POA: Diagnosis not present

## 2021-02-04 DIAGNOSIS — M1A09X0 Idiopathic chronic gout, multiple sites, without tophus (tophi): Secondary | ICD-10-CM | POA: Diagnosis not present

## 2021-02-04 DIAGNOSIS — Z6831 Body mass index (BMI) 31.0-31.9, adult: Secondary | ICD-10-CM | POA: Diagnosis not present

## 2021-02-04 DIAGNOSIS — M0579 Rheumatoid arthritis with rheumatoid factor of multiple sites without organ or systems involvement: Secondary | ICD-10-CM | POA: Diagnosis not present

## 2021-02-04 DIAGNOSIS — M150 Primary generalized (osteo)arthritis: Secondary | ICD-10-CM | POA: Diagnosis not present

## 2021-02-04 DIAGNOSIS — M2550 Pain in unspecified joint: Secondary | ICD-10-CM | POA: Diagnosis not present

## 2021-02-04 DIAGNOSIS — E669 Obesity, unspecified: Secondary | ICD-10-CM | POA: Diagnosis not present

## 2021-02-25 IMAGING — CT CT HEART MORP W/ CTA COR W/ SCORE W/ CA W/CM &/OR W/O CM
4 of 7 series · 8 of 20 positions shown, 9 images · non-contrast
Comparison: Chest radiograph 01/16/2016.
COMPARISON: Chest radiograph 01/16/2016.

Addendum:
EXAM:
OVER-READ INTERPRETATION  CT CHEST

The following report is an over-read performed by radiologist Dr.
Malini Kollie [REDACTED] on 09/14/2019. This over-read
does not include interpretation of cardiac or coronary anatomy or
pathology. The coronary CTA interpretation by the cardiologist is
attached.
TECHNIQUE: The patient was scanned on a Phillips Force scanner.

[Series 6: best diast 76 % · axial · 0.39mm/px · z∈[+1277,+1322]mm · 2 of 343 slices shown, 3 images]
[im 115/343  vessel]
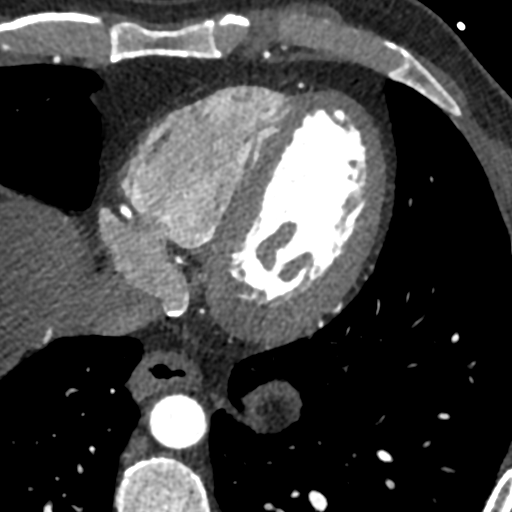
[im 115/343  lung]
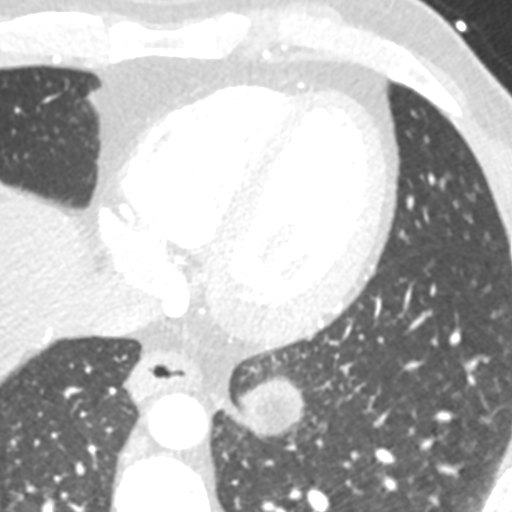
[im 229/343  vessel]
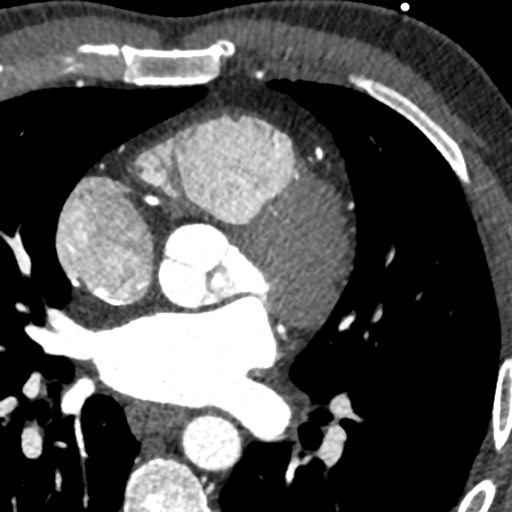

[Series 7: best syst 34 % · axial · 0.39mm/px · z∈[+1277,+1322]mm · 2 of 343 slices shown]
[im 115/343  vessel]
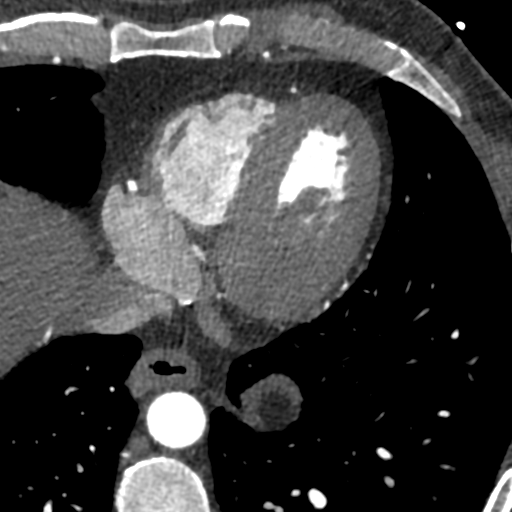
[im 229/343  vessel]
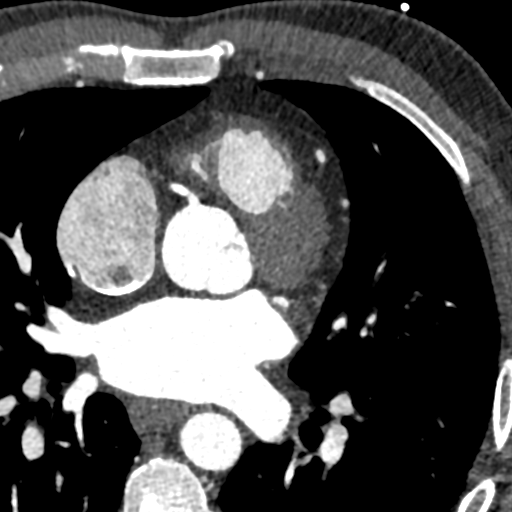

[Series 8: ts diast sharp 34 % · axial · 0.39mm/px · z∈[+1277,+1322]mm · 2 of 343 slices shown]
[im 115/343  lung]
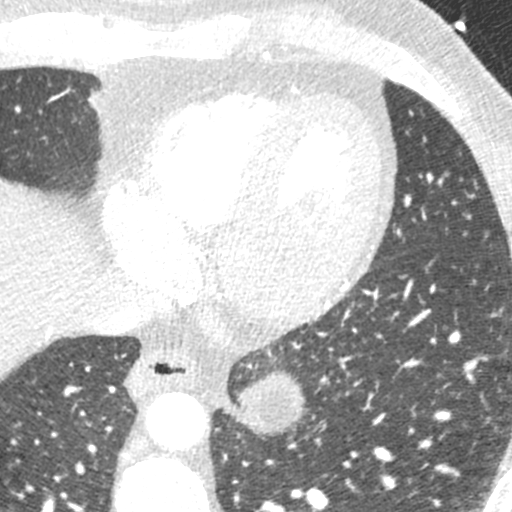
[im 229/343  lung]
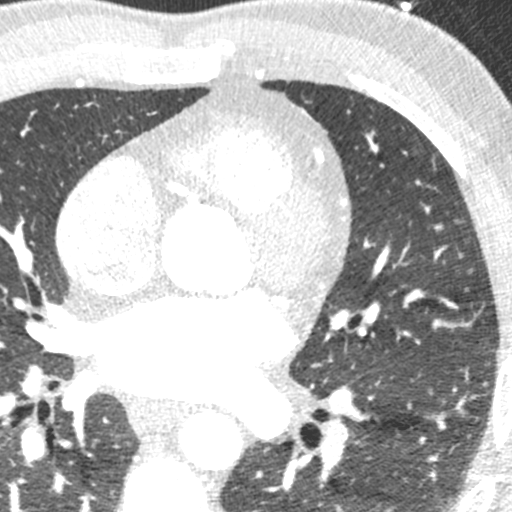

[Series 9: ts syst sharp 34 % · axial · 0.39mm/px · z∈[+1277,+1322]mm · 2 of 343 slices shown]
[im 115/343  lung]
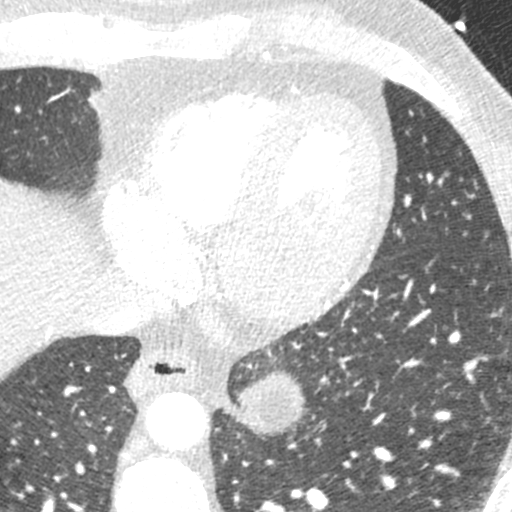
[im 229/343  lung]
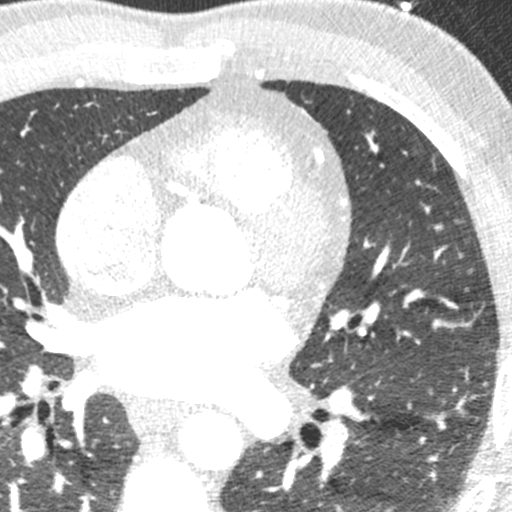

[8 of 20 positions shown; findings below may reference images not displayed]

FINDINGS: Vascular: Normal aortic caliber, without dissection. Tortuous
thoracic aorta. No central pulmonary embolism, on this non-dedicated
study.

Mediastinum/Nodes: No imaged thoracic adenopathy.

Lungs/Pleura: No pleural fluid.  Clear imaged lungs.

Upper Abdomen: Normal imaged portions of the  liver, spleen, stomach

Musculoskeletal: No acute osseous abnormality.
IMPRESSION: No acute findings in the imaged extracardiac chest.

EXAM:
Cardiac/Coronary  CT
FINDINGS: A 120 kV prospective scan was triggered in the descending thoracic
aorta at 111 HU's. Axial non-contrast 3 mm slices were carried out
through the heart. The data set was analyzed on a dedicated work
station and scored using the Agatson method. Gantry rotation speed
was 250 msecs and collimation was .6 mm. No beta blockade and 0.8 mg
of sl NTG was given. The 3D data set was reconstructed in 5%
intervals of the 67-82 % of the R-R cycle. Diastolic phases were
analyzed on a dedicated work station using MPR, MIP and VRT modes.
The patient received 80 cc of contrast.

Aorta:  Normal size.  No calcifications.  No dissection.

Aortic Valve:  Trileaflet.  No calcifications.

Coronary Arteries:  Normal coronary origin.  Right dominance.

RCA is a large dominant artery that gives rise to PDA and PLVB.
There is no plaque.

Left main is a large artery that gives rise to LAD and LCX arteries.
There is no plaque.

LAD is a large vessel that gives rise to a large diagonal branch.
There is mild calcified plaque in the proximal LAD with associated
stenosis of 25-49%.

LCX is a non-dominant artery that gives rise to one large OM1
branch. There is no plaque.

Other findings:

Normal pulmonary vein drainage into the left atrium.

Normal let atrial appendage without a thrombus.

Normal size of the pulmonary artery.
IMPRESSION: 1. Coronary calcium score of 104. This was 97th percentile for age
and sex matched control.

2.  Normal coronary origin with right dominance.

3.  Mild atherosclerotic plaque in the proximal LAD.  CAD-RADS 2.

4.  Consider non-atherosclerotic causes of chest pain.

5.  Consider preventive therapy and risk factor modification.

Doel R [place]

*** End of Addendum ***
EXAM:
OVER-READ INTERPRETATION  CT CHEST

The following report is an over-read performed by radiologist Dr.
Malini Kollie [REDACTED] on 09/14/2019. This over-read
does not include interpretation of cardiac or coronary anatomy or
pathology. The coronary CTA interpretation by the cardiologist is
attached.
FINDINGS: Vascular: Normal aortic caliber, without dissection. Tortuous
thoracic aorta. No central pulmonary embolism, on this non-dedicated
study.

Mediastinum/Nodes: No imaged thoracic adenopathy.

Lungs/Pleura: No pleural fluid.  Clear imaged lungs.

Upper Abdomen: Normal imaged portions of the  liver, spleen, stomach

Musculoskeletal: No acute osseous abnormality.
IMPRESSION: No acute findings in the imaged extracardiac chest.

## 2021-03-20 DIAGNOSIS — H33321 Round hole, right eye: Secondary | ICD-10-CM | POA: Diagnosis not present

## 2021-03-20 DIAGNOSIS — H43823 Vitreomacular adhesion, bilateral: Secondary | ICD-10-CM | POA: Diagnosis not present

## 2021-03-20 DIAGNOSIS — H2513 Age-related nuclear cataract, bilateral: Secondary | ICD-10-CM | POA: Diagnosis not present

## 2021-04-02 DIAGNOSIS — H33321 Round hole, right eye: Secondary | ICD-10-CM | POA: Diagnosis not present

## 2021-04-23 DIAGNOSIS — H31091 Other chorioretinal scars, right eye: Secondary | ICD-10-CM | POA: Diagnosis not present

## 2021-04-23 DIAGNOSIS — H43823 Vitreomacular adhesion, bilateral: Secondary | ICD-10-CM | POA: Diagnosis not present

## 2021-05-01 DIAGNOSIS — R202 Paresthesia of skin: Secondary | ICD-10-CM | POA: Diagnosis not present

## 2021-05-01 DIAGNOSIS — R1310 Dysphagia, unspecified: Secondary | ICD-10-CM | POA: Diagnosis not present

## 2021-05-01 DIAGNOSIS — R200 Anesthesia of skin: Secondary | ICD-10-CM | POA: Diagnosis not present

## 2021-05-01 DIAGNOSIS — M5412 Radiculopathy, cervical region: Secondary | ICD-10-CM | POA: Diagnosis not present

## 2021-05-01 DIAGNOSIS — M0540 Rheumatoid myopathy with rheumatoid arthritis of unspecified site: Secondary | ICD-10-CM | POA: Diagnosis not present

## 2021-05-01 DIAGNOSIS — Z6830 Body mass index (BMI) 30.0-30.9, adult: Secondary | ICD-10-CM | POA: Diagnosis not present

## 2021-05-02 ENCOUNTER — Encounter: Payer: Self-pay | Admitting: Internal Medicine

## 2021-05-10 DIAGNOSIS — M1A09X0 Idiopathic chronic gout, multiple sites, without tophus (tophi): Secondary | ICD-10-CM | POA: Diagnosis not present

## 2021-05-10 DIAGNOSIS — M2550 Pain in unspecified joint: Secondary | ICD-10-CM | POA: Diagnosis not present

## 2021-05-10 DIAGNOSIS — Z6831 Body mass index (BMI) 31.0-31.9, adult: Secondary | ICD-10-CM | POA: Diagnosis not present

## 2021-05-10 DIAGNOSIS — E669 Obesity, unspecified: Secondary | ICD-10-CM | POA: Diagnosis not present

## 2021-05-10 DIAGNOSIS — M0579 Rheumatoid arthritis with rheumatoid factor of multiple sites without organ or systems involvement: Secondary | ICD-10-CM | POA: Diagnosis not present

## 2021-05-10 DIAGNOSIS — M150 Primary generalized (osteo)arthritis: Secondary | ICD-10-CM | POA: Diagnosis not present

## 2021-05-22 DIAGNOSIS — R202 Paresthesia of skin: Secondary | ICD-10-CM | POA: Diagnosis not present

## 2021-05-22 DIAGNOSIS — R200 Anesthesia of skin: Secondary | ICD-10-CM | POA: Diagnosis not present

## 2021-05-22 DIAGNOSIS — M4722 Other spondylosis with radiculopathy, cervical region: Secondary | ICD-10-CM | POA: Diagnosis not present

## 2021-05-22 DIAGNOSIS — M50123 Cervical disc disorder at C6-C7 level with radiculopathy: Secondary | ICD-10-CM | POA: Diagnosis not present

## 2021-05-22 DIAGNOSIS — M5412 Radiculopathy, cervical region: Secondary | ICD-10-CM | POA: Diagnosis not present

## 2021-08-20 DIAGNOSIS — E7849 Other hyperlipidemia: Secondary | ICD-10-CM | POA: Diagnosis not present

## 2021-08-22 DIAGNOSIS — N281 Cyst of kidney, acquired: Secondary | ICD-10-CM | POA: Diagnosis not present

## 2021-08-22 DIAGNOSIS — N200 Calculus of kidney: Secondary | ICD-10-CM | POA: Diagnosis not present

## 2021-08-22 DIAGNOSIS — N400 Enlarged prostate without lower urinary tract symptoms: Secondary | ICD-10-CM | POA: Diagnosis not present

## 2021-08-23 DIAGNOSIS — N281 Cyst of kidney, acquired: Secondary | ICD-10-CM | POA: Diagnosis not present

## 2021-08-23 DIAGNOSIS — N200 Calculus of kidney: Secondary | ICD-10-CM | POA: Diagnosis not present

## 2021-08-28 DIAGNOSIS — M2550 Pain in unspecified joint: Secondary | ICD-10-CM | POA: Diagnosis not present

## 2021-08-28 DIAGNOSIS — M1A09X0 Idiopathic chronic gout, multiple sites, without tophus (tophi): Secondary | ICD-10-CM | POA: Diagnosis not present

## 2021-08-28 DIAGNOSIS — M150 Primary generalized (osteo)arthritis: Secondary | ICD-10-CM | POA: Diagnosis not present

## 2021-08-28 DIAGNOSIS — M0579 Rheumatoid arthritis with rheumatoid factor of multiple sites without organ or systems involvement: Secondary | ICD-10-CM | POA: Diagnosis not present

## 2021-08-28 DIAGNOSIS — Z6830 Body mass index (BMI) 30.0-30.9, adult: Secondary | ICD-10-CM | POA: Diagnosis not present

## 2021-08-28 DIAGNOSIS — E669 Obesity, unspecified: Secondary | ICD-10-CM | POA: Diagnosis not present

## 2021-09-17 NOTE — Progress Notes (Signed)
Primary Care Physician:  Boyd, William S, PA ° °Primary Gastroenterologist:  Charles K. Carver, DO ° ° °Chief Complaint  °Patient presents with  ° Dysphagia  °  Never had dilation. Solid foods  ° Gastroesophageal Reflux  °  sometimes  ° ° °HPI:  Jerry Page is a 55 y.o. male here at the request of William Boyd, PA-C for further evaluation of dysphagia. ° °Patient has had symptoms over the past 2 years, getting worse.  Initially was only occasionally.  Worse with shredded chicken/nachos.  At times food becomes lodged and he has to vomit to get relief.  Afterwards he will have what he feels like his heartburn.  Saw his PCP back in September for the symptoms.  He was started on omeprazole which she took for about a month but noted no improvement in his symptoms.  Denies any weight loss.  No alcohol use.  Rarely takes a BC powder.  No other NSAIDs.  Denies any abdominal pain.  Bowel movements are regular.  No blood in the stool or melena. He is never had an EGD or colonoscopy. °  °  °Current Outpatient Medications  °Medication Sig Dispense Refill  ° Febuxostat 80 MG TABS Take 1 tablet by mouth daily.    ° lisinopril (ZESTRIL) 10 MG tablet Take 10 mg by mouth daily.    ° simvastatin (ZOCOR) 20 MG tablet Take 20 mg by mouth at bedtime.    ° °No current facility-administered medications for this visit.  ° ° °Allergies as of 09/18/2021  ° (No Known Allergies)  ° ° °Past Medical History:  °Diagnosis Date  ° DDD (degenerative disc disease), cervical   ° Gout   ° Hypertension   ° RA (rheumatoid arthritis) (HCC)   ° Recurrent nephrolithiasis   ° ° °Past Surgical History:  °Procedure Laterality Date  ° HAND SURGERY Right   ° LITHOTRIPSY  10/2012  ° ° °Family History  °Problem Relation Age of Onset  ° Colon cancer Neg Hx   ° ° °Social History  ° °Socioeconomic History  ° Marital status: Married  °  Spouse name: Not on file  ° Number of children: Not on file  ° Years of education: Not on file  ° Highest education level:  Not on file  °Occupational History  ° Not on file  °Tobacco Use  ° Smoking status: Never  ° Smokeless tobacco: Never  °Substance and Sexual Activity  ° Alcohol use: Not Currently  ° Drug use: Not Currently  ° Sexual activity: Not on file  °Other Topics Concern  ° Not on file  °Social History Narrative  ° Not on file  ° °Social Determinants of Health  ° °Financial Resource Strain: Not on file  °Food Insecurity: Not on file  °Transportation Needs: Not on file  °Physical Activity: Not on file  °Stress: Not on file  °Social Connections: Not on file  °Intimate Partner Violence: Not on file  ° ° °  °ROS: ° °General: Negative for anorexia, weight loss, fever, chills, fatigue, weakness. °Eyes: Negative for vision changes.  °ENT: Negative for hoarseness,  nasal congestion. See hpi °CV: Negative for chest pain, angina, palpitations, dyspnea on exertion, peripheral edema.  °Respiratory: Negative for dyspnea at rest, dyspnea on exertion, cough, sputum, wheezing.  °GI: See history of present illness. °GU:  Negative for dysuria, hematuria, urinary incontinence, urinary frequency, nocturnal urination.  °MS: +joint,back,neck pain °Derm: Negative for rash or itching.  °Neuro: Negative for weakness, abnormal sensation, seizure, frequent   headaches, memory loss, confusion.  Psych: Negative for anxiety, depression, suicidal ideation, hallucinations.  Endo: Negative for unusual weight change.  Heme: Negative for bruising or bleeding. Allergy: Negative for rash or hives.    Physical Examination:  BP 132/80    Pulse (!) 51    Temp (!) 97.3 F (36.3 C) (Temporal)    Ht 6' (1.829 m)    Wt 213 lb 12.8 oz (97 kg)    BMI 29.00 kg/m    General: Well-nourished, well-developed in no acute distress.  Head: Normocephalic, atraumatic.   Eyes: Conjunctiva pink, no icterus. Mouth:masked Neck: Supple without thyromegaly, masses, or lymphadenopathy.  Lungs: Clear to auscultation bilaterally.  Heart: Regular rate and rhythm, no murmurs  rubs or gallops.  Abdomen: Bowel sounds are normal, nontender, nondistended, no hepatosplenomegaly or masses, no abdominal bruits or    hernia , no rebound or guarding.   Rectal: not performed Extremities: No lower extremity edema. No clubbing or deformities.  Neuro: Alert and oriented x 4 , grossly normal neurologically.  Skin: Warm and dry, no rash or jaundice.   Psych: Alert and cooperative, normal mood and affect.   Imaging Studies: No results found.   Assessment:  Esophageal dysphagia: Noted 2 years ago.  Progressively worsened.  Episodes of impaction, relieved with vomiting.  No significant heartburn symptoms.  One month of omeprazole provided no improvement.  Differential diagnosis includes esophageal stricture, eosinophilic esophagitis, malignancy.  Recommend EGD in the near future with esophageal dilation.  Colon cancer screening: No prior colonoscopy.  Recommend colonoscopy whenever he is ready.  He declined at this time, wanting to concentrate on his dysphagia.  He will let us know when he is ready to proceed.  Plan: EGD with esophageal dilation with Dr. Abbey Chatters.  ASA 2.  I have discussed the risks, alternatives, benefits with regards to but not limited to the risk of reaction to medication, bleeding, infection, perforation and the patient is agreeable to proceed. Written consent to be obtained. Colonoscopy whenever he is ready.  He will let us know.

## 2021-09-17 NOTE — H&P (View-Only) (Signed)
Primary Care Physician:  Lavella Lemons, PA  Primary Gastroenterologist:  Elon Alas. Abbey Chatters, DO   Chief Complaint  Patient presents with   Dysphagia    Never had dilation. Solid foods   Gastroesophageal Reflux    sometimes    HPI:  Jerry Page is a 56 y.o. male here at the request of Aggie Hacker, PA-C for further evaluation of dysphagia.  Patient has had symptoms over the past 2 years, getting worse.  Initially was only occasionally.  Worse with shredded chicken/nachos.  At times food becomes lodged and he has to vomit to get relief.  Afterwards he will have what he feels like his heartburn.  Saw his PCP back in September for the symptoms.  He was started on omeprazole which she took for about a month but noted no improvement in his symptoms.  Denies any weight loss.  No alcohol use.  Rarely takes a BC powder.  No other NSAIDs.  Denies any abdominal pain.  Bowel movements are regular.  No blood in the stool or melena. He is never had an EGD or colonoscopy.     Current Outpatient Medications  Medication Sig Dispense Refill   Febuxostat 80 MG TABS Take 1 tablet by mouth daily.     lisinopril (ZESTRIL) 10 MG tablet Take 10 mg by mouth daily.     simvastatin (ZOCOR) 20 MG tablet Take 20 mg by mouth at bedtime.     No current facility-administered medications for this visit.    Allergies as of 09/18/2021   (No Known Allergies)    Past Medical History:  Diagnosis Date   DDD (degenerative disc disease), cervical    Gout    Hypertension    RA (rheumatoid arthritis) ([place])    Recurrent nephrolithiasis     Past Surgical History:  Procedure Laterality Date   HAND SURGERY Right    LITHOTRIPSY  10/2012    Family History  Problem Relation Age of Onset   Colon cancer Neg Hx     Social History   Socioeconomic History   Marital status: Married    Spouse name: Not on file   Number of children: Not on file   Years of education: Not on file   Highest education level:  Not on file  Occupational History   Not on file  Tobacco Use   Smoking status: Never   Smokeless tobacco: Never  Substance and Sexual Activity   Alcohol use: Not Currently   Drug use: Not Currently   Sexual activity: Not on file  Other Topics Concern   Not on file  Social History Narrative   Not on file   Social Determinants of Health   Financial Resource Strain: Not on file  Food Insecurity: Not on file  Transportation Needs: Not on file  Physical Activity: Not on file  Stress: Not on file  Social Connections: Not on file  Intimate Partner Violence: Not on file      ROS:  General: Negative for anorexia, weight loss, fever, chills, fatigue, weakness. Eyes: Negative for vision changes.  ENT: Negative for hoarseness,  nasal congestion. See hpi CV: Negative for chest pain, angina, palpitations, dyspnea on exertion, peripheral edema.  Respiratory: Negative for dyspnea at rest, dyspnea on exertion, cough, sputum, wheezing.  GI: See history of present illness. GU:  Negative for dysuria, hematuria, urinary incontinence, urinary frequency, nocturnal urination.  MS: +joint,back,neck pain Derm: Negative for rash or itching.  Neuro: Negative for weakness, abnormal sensation, seizure, frequent  headaches, memory loss, confusion.  Psych: Negative for anxiety, depression, suicidal ideation, hallucinations.  Endo: Negative for unusual weight change.  Heme: Negative for bruising or bleeding. Allergy: Negative for rash or hives.    Physical Examination:  BP 132/80    Pulse (!) 51    Temp (!) 97.3 F (36.3 C) (Temporal)    Ht 6' (1.829 m)    Wt 213 lb 12.8 oz (97 kg)    BMI 29.00 kg/m    General: Well-nourished, well-developed in no acute distress.  Head: Normocephalic, atraumatic.   Eyes: Conjunctiva pink, no icterus. Mouth:masked Neck: Supple without thyromegaly, masses, or lymphadenopathy.  Lungs: Clear to auscultation bilaterally.  Heart: Regular rate and rhythm, no murmurs  rubs or gallops.  Abdomen: Bowel sounds are normal, nontender, nondistended, no hepatosplenomegaly or masses, no abdominal bruits or    hernia , no rebound or guarding.   Rectal: not performed Extremities: No lower extremity edema. No clubbing or deformities.  Neuro: Alert and oriented x 4 , grossly normal neurologically.  Skin: Warm and dry, no rash or jaundice.   Psych: Alert and cooperative, normal mood and affect.   Imaging Studies: No results found.   Assessment:  Esophageal dysphagia: Noted 2 years ago.  Progressively worsened.  Episodes of impaction, relieved with vomiting.  No significant heartburn symptoms.  One month of omeprazole provided no improvement.  Differential diagnosis includes esophageal stricture, eosinophilic esophagitis, malignancy.  Recommend EGD in the near future with esophageal dilation.  Colon cancer screening: No prior colonoscopy.  Recommend colonoscopy whenever he is ready.  He declined at this time, wanting to concentrate on his dysphagia.  He will let us know when he is ready to proceed.  Plan: EGD with esophageal dilation with Dr. Abbey Chatters.  ASA 2.  I have discussed the risks, alternatives, benefits with regards to but not limited to the risk of reaction to medication, bleeding, infection, perforation and the patient is agreeable to proceed. Written consent to be obtained. Colonoscopy whenever he is ready.  He will let us know.

## 2021-09-18 ENCOUNTER — Telehealth: Payer: Self-pay | Admitting: *Deleted

## 2021-09-18 ENCOUNTER — Ambulatory Visit (INDEPENDENT_AMBULATORY_CARE_PROVIDER_SITE_OTHER): Payer: 59 | Admitting: Gastroenterology

## 2021-09-18 ENCOUNTER — Encounter: Payer: Self-pay | Admitting: *Deleted

## 2021-09-18 ENCOUNTER — Encounter: Payer: Self-pay | Admitting: Gastroenterology

## 2021-09-18 ENCOUNTER — Other Ambulatory Visit: Payer: Self-pay

## 2021-09-18 DIAGNOSIS — R1319 Other dysphagia: Secondary | ICD-10-CM | POA: Diagnosis not present

## 2021-09-18 NOTE — Telephone Encounter (Signed)
Per [employer]: "Notification or Prior Authorization is not required for the requested services  This [employer] plan does not currently require a prior authorization for these services. If you have general questions about the prior authorization requirements, please call us at [phone] or visit [email or web address] > Clinician Resources > Advance and Admission Notification Requirements. The number above acknowledges your notification. Please write this number down for future reference. Notification is not a guarantee of coverage or payment.  Decision ID [id number]"

## 2021-09-18 NOTE — Patient Instructions (Signed)
Upper endoscopy with Dr. Marletta Lor.  Please see separate instructions.

## 2021-09-19 ENCOUNTER — Encounter (HOSPITAL_COMMUNITY): Payer: Self-pay

## 2021-09-19 ENCOUNTER — Encounter (HOSPITAL_COMMUNITY)
Admission: RE | Admit: 2021-09-19 | Discharge: 2021-09-19 | Disposition: A | Discharge status: Home / Self Care | Payer: 59 | Source: Ambulatory Visit | Attending: Internal Medicine | Admitting: Internal Medicine

## 2021-09-19 HISTORY — DX: Bradycardia, unspecified: R00.1

## 2021-09-19 NOTE — Telephone Encounter (Signed)
Received call from Pre service center stating PA was required that [facility] I advised of decision ID# below. She then called back and stated [facility] and [facility] reported it was required.  [facility] and spoke with Harriett SineNancy. PA submitted over the phone. Pending ref# [id number]

## 2021-09-20 NOTE — Telephone Encounter (Signed)
PA approved until 12/22/2021

## 2021-09-23 ENCOUNTER — Ambulatory Visit (HOSPITAL_COMMUNITY)
Admission: RE | Admit: 2021-09-23 | Discharge: 2021-09-23 | Disposition: A | Discharge status: Home / Self Care | Payer: 59 | Attending: Internal Medicine | Admitting: Internal Medicine

## 2021-09-23 ENCOUNTER — Ambulatory Visit (HOSPITAL_BASED_OUTPATIENT_CLINIC_OR_DEPARTMENT_OTHER): Payer: 59 | Admitting: Anesthesiology

## 2021-09-23 ENCOUNTER — Encounter (HOSPITAL_COMMUNITY): Payer: Self-pay

## 2021-09-23 ENCOUNTER — Other Ambulatory Visit: Payer: Self-pay

## 2021-09-23 ENCOUNTER — Encounter (HOSPITAL_COMMUNITY)
Admission: RE | Disposition: A | Discharge status: Home / Self Care | Payer: Self-pay | Source: Home / Self Care | Attending: Internal Medicine

## 2021-09-23 ENCOUNTER — Ambulatory Visit (HOSPITAL_COMMUNITY): Payer: 59 | Admitting: Anesthesiology

## 2021-09-23 DIAGNOSIS — K222 Esophageal obstruction: Secondary | ICD-10-CM

## 2021-09-23 DIAGNOSIS — K2950 Unspecified chronic gastritis without bleeding: Secondary | ICD-10-CM | POA: Insufficient documentation

## 2021-09-23 DIAGNOSIS — K3189 Other diseases of stomach and duodenum: Secondary | ICD-10-CM

## 2021-09-23 DIAGNOSIS — I10 Essential (primary) hypertension: Secondary | ICD-10-CM | POA: Diagnosis not present

## 2021-09-23 DIAGNOSIS — R1310 Dysphagia, unspecified: Secondary | ICD-10-CM | POA: Diagnosis not present

## 2021-09-23 DIAGNOSIS — K219 Gastro-esophageal reflux disease without esophagitis: Secondary | ICD-10-CM | POA: Diagnosis not present

## 2021-09-23 DIAGNOSIS — K2970 Gastritis, unspecified, without bleeding: Secondary | ICD-10-CM

## 2021-09-23 DIAGNOSIS — K766 Portal hypertension: Secondary | ICD-10-CM | POA: Diagnosis not present

## 2021-09-23 DIAGNOSIS — B9681 Helicobacter pylori [H. pylori] as the cause of diseases classified elsewhere: Secondary | ICD-10-CM | POA: Insufficient documentation

## 2021-09-23 HISTORY — PX: BALLOON DILATION: SHX5330

## 2021-09-23 HISTORY — PX: BIOPSY: SHX5522

## 2021-09-23 HISTORY — PX: ESOPHAGOGASTRODUODENOSCOPY (EGD) WITH PROPOFOL: SHX5813

## 2021-09-23 SURGERY — ESOPHAGOGASTRODUODENOSCOPY (EGD) WITH PROPOFOL
Anesthesia: General

## 2021-09-23 MED ORDER — LACTATED RINGERS IV SOLN: INTRAVENOUS | Status: DC | PRN

## 2021-09-23 MED ORDER — OMEPRAZOLE 40 MG PO CPDR
40.0000 mg | DELAYED_RELEASE_CAPSULE | Freq: Every day | ORAL | 5 refills | Status: DC
Start: 2021-09-23 — End: 2022-06-19

## 2021-09-23 MED ORDER — LIDOCAINE HCL (CARDIAC) PF 100 MG/5ML IV SOSY
PREFILLED_SYRINGE | INTRAVENOUS | Status: DC | PRN
  Administered 2021-09-23: 50 mg via INTRAVENOUS

## 2021-09-23 MED ORDER — PROPOFOL 10 MG/ML IV BOLUS
INTRAVENOUS | Status: DC | PRN
Start: 2021-09-23 — End: 2021-09-23
  Administered 2021-09-23: 100 mg via INTRAVENOUS
  Administered 2021-09-23: 50 mg via INTRAVENOUS

## 2021-09-23 MED ORDER — LACTATED RINGERS IV SOLN: INTRAVENOUS | Status: DC

## 2021-09-23 NOTE — Op Note (Signed)
[facility] Patient Name: Jerry Page Procedure Date: 09/23/2021 11:21 AM MRN: [id number] Date of Birth: 1965-08-29 Attending MD: Elon Alas. Abbey Chatters DO CSN: [id number] Age: 56 Admit Type: Outpatient Procedure:                Upper GI endoscopy Indications:              Dysphagia Providers:                Elon Alas. Abbey Chatters, DO, Hughie Closs RN, RN, [employer], [place] Risa Grill, Technician Referring MD:              Medicines:                See the Anesthesia note for documentation of the                            administered medications Complications:            No immediate complications. Estimated Blood Loss:     Estimated blood loss was minimal. Procedure:                Pre-Anesthesia Assessment:                           - The anesthesia plan was to use monitored                            anesthesia care (MAC).                           After obtaining informed consent, the endoscope was                            passed under direct vision. Throughout the                            procedure, the patient's blood pressure, pulse, and                            oxygen saturations were monitored continuously. The                            GIF-H190 ([id number]) scope was introduced through the                            mouth, and advanced to the second part of duodenum.                            The upper GI endoscopy was accomplished without                            difficulty. The patient tolerated the procedure                            well. Scope  In: 11:32:22 AM Scope Out: 11:38:07 AM Total Procedure Duration: 0 hours 5 minutes 45 seconds  Findings:      Multiple mild Schatzki rings were found at the gastroesophageal       junction. A TTS dilator was passed through the scope. Dilation with an       18-19-20 mm balloon dilator was performed to 20 mm. The dilation site       was examined and showed mild mucosal disruption and  moderate improvement       in luminal narrowing.      Diffuse mild inflammation characterized by erythema was found in the       entire examined stomach. Biopsies were taken with a cold forceps for       Helicobacter pylori testing.      ?Portal hypertensive gastropathy was found in the gastric fundus and in       the gastric body.      The duodenal bulb, first portion of the duodenum and second portion of       the duodenum were normal. Impression:               - Mild Schatzki ring. Dilated.                           - Gastritis. Biopsied.                           - Portal hypertensive gastropathy.                           - Normal duodenal bulb, first portion of the                            duodenum and second portion of the duodenum. Moderate Sedation:      Per Anesthesia Care Recommendation:           - Patient has a contact number available for                            emergencies. The signs and symptoms of potential                            delayed complications were discussed with the                            patient. Return to normal activities tomorrow.                            Written discharge instructions were provided to the                            patient.                           - Resume previous diet.                           - Continue present medications.                           -  Await pathology results.                           - Repeat upper endoscopy PRN for retreatment.                           - Return to GI clinic in 4 months.                           - Consider [place] with elastography to work up for                            chronic liver disease. Procedure Code(s):        --- Professional ---                           [phone], Esophagogastroduodenoscopy, flexible,                            transoral; with transendoscopic balloon dilation of                            esophagus (less than 30 mm diameter)                           [id number],  59, Esophagogastroduodenoscopy, flexible,                            transoral; with biopsy, single or multiple Diagnosis Code(s):        --- Professional ---                           K22.2, Esophageal obstruction                           K29.70, Gastritis, unspecified, without bleeding                           K76.6, Portal hypertension                           K31.89, Other diseases of stomach and duodenum                           R13.10, Dysphagia, unspecified CPT copyright 2019 American Medical Association. All rights reserved. The codes documented in this report are preliminary and upon coder review may  be revised to meet current compliance requirements. Elon Alas. Abbey Chatters, DO [place] Abbey Chatters, DO 09/23/2021 11:46:51 AM This report has been signed electronically. Number of Addenda: 0

## 2021-09-23 NOTE — Interval H&P Note (Signed)
History and Physical Interval Note:  09/23/2021 11:07 AM  Jerry Page  has presented today for surgery, with the diagnosis of dysphagia.  The various methods of treatment have been discussed with the patient and family. After consideration of risks, benefits and other options for treatment, the patient has consented to  Procedure(s) with comments: ESOPHAGOGASTRODUODENOSCOPY (EGD) WITH PROPOFOL (N/A) - 11:45am, ASA 2 BALLOON DILATION (N/A) as a surgical intervention.  The patient's history has been reviewed, patient examined, no change in status, stable for surgery.  I have reviewed the patient's chart and labs.  Questions were answered to the patient's satisfaction.     Lanelle Balharles K [place] Saber

## 2021-09-23 NOTE — Discharge Instructions (Addendum)
EGD Discharge instructions Please read the instructions outlined below and refer to this sheet in the next few weeks. These discharge instructions provide you with general information on caring for yourself after you leave the hospital. Your doctor may also give you specific instructions. While your treatment has been planned according to the most current medical practices available, unavoidable complications occasionally occur. If you have any problems or questions after discharge, please call your doctor. ACTIVITY You may resume your regular activity but move at a slower pace for the next 24 hours.  Take frequent rest periods for the next 24 hours.  Walking will help expel (get rid of) the air and reduce the bloated feeling in your abdomen.  No driving for 24 hours (because of the anesthesia (medicine) used during the test).  You may shower.  Do not sign any important legal documents or operate any machinery for 24 hours (because of the anesthesia used during the test).  NUTRITION Drink plenty of fluids.  You may resume your normal diet.  Begin with a light meal and progress to your normal diet.  Avoid alcoholic beverages for 24 hours or as instructed by your caregiver.  MEDICATIONS You may resume your normal medications unless your caregiver tells you otherwise.  WHAT YOU CAN EXPECT TODAY You may experience abdominal discomfort such as a feeling of fullness or gas pains.  FOLLOW-UP Your doctor will discuss the results of your test with you.  SEEK IMMEDIATE MEDICAL ATTENTION IF ANY OF THE FOLLOWING OCCUR: Excessive nausea (feeling sick to your stomach) and/or vomiting.  Severe abdominal pain and distention (swelling).  Trouble swallowing.  Temperature over 101 F (37.8 C).  Rectal bleeding or vomiting of blood.   You had multiple Schatzki's rings at the distal portion of your esophagus.  I stretched these with the balloon today with good treatment.  Hopefully this improves your  swallowing.  Also took biopsies of your stomach to rule out infection of the bacteria called H. pylori. Await pathology results, my office will contact you. I am going to start you on daily Prilosec for the next 12 weeks.  Follow-up with GI in 4 months.     I hope you have a great rest of your week!  Elon Alas. Abbey Chatters, D.O. Gastroenterology and Hepatology [facility]

## 2021-09-23 NOTE — Anesthesia Postprocedure Evaluation (Signed)
Anesthesia Post Note  Patient: Jerry Page  Procedure(s) Performed: ESOPHAGOGASTRODUODENOSCOPY (EGD) WITH PROPOFOL BALLOON DILATION BIOPSY  Patient location during evaluation: Phase II Anesthesia Type: General Level of consciousness: awake Pain management: pain level controlled Vital Signs Assessment: post-procedure vital signs reviewed and stable Respiratory status: spontaneous breathing and respiratory function stable Cardiovascular status: blood pressure returned to baseline and stable Postop Assessment: no headache and no apparent nausea or vomiting Anesthetic complications: no Comments: Late entry   No notable events documented.   Last Vitals:  Vitals:   09/23/21 1033 09/23/21 1141  BP: 130/80 (!) 100/52  Resp:  10  Temp:  36.7 C  SpO2:  98%    Last Pain:  Vitals:   09/23/21 1141  TempSrc: Axillary  PainSc: 0-No pain                 Louann Sjogren

## 2021-09-23 NOTE — Transfer of Care (Signed)
Immediate Anesthesia Transfer of Care Note  Patient: Jerry Page  Procedure(s) Performed: ESOPHAGOGASTRODUODENOSCOPY (EGD) WITH PROPOFOL BALLOON DILATION BIOPSY  Patient Location: Short Stay  Anesthesia Type:General  Level of Consciousness: drowsy  Airway & Oxygen Therapy: Patient Spontanous Breathing  Post-op Assessment: Report given to RN and Post -op Vital signs reviewed and stable  Post vital signs: Reviewed and stable  Last Vitals:  Vitals Value Taken Time  BP    Temp    Pulse    Resp    SpO2      Last Pain:  Vitals:   09/23/21 1127  PainSc: 0-No pain         Complications: No notable events documented.

## 2021-09-23 NOTE — Anesthesia Procedure Notes (Signed)
Date/Time: 09/23/2021 11:35 AM Performed by: Julian ReilWelty, Gaynelle Pastrana F, CRNA Pre-anesthesia Checklist: Patient identified, Emergency Drugs available, Suction available and Patient being monitored Patient Re-evaluated:Patient Re-evaluated prior to induction Oxygen Delivery Method: Nasal cannula Induction Type: IV induction Placement Confirmation: positive ETCO2

## 2021-09-23 NOTE — Anesthesia Preprocedure Evaluation (Signed)
Anesthesia Evaluation  Patient identified by MRN, date of birth, ID band Patient awake    Reviewed: Allergy & Precautions, H&P , NPO status , Patient's Chart, lab work & pertinent test results, reviewed documented beta blocker date and time   Airway Mallampati: II  TM Distance: >3 FB Neck ROM: full    Dental no notable dental hx.    Pulmonary neg pulmonary ROS,    Pulmonary exam normal breath sounds clear to auscultation       Cardiovascular Exercise Tolerance: Good hypertension, negative cardio ROS   Rhythm:regular Rate:Normal     Neuro/Psych negative neurological ROS  negative psych ROS   GI/Hepatic negative GI ROS, Neg liver ROS,   Endo/Other  negative endocrine ROS  Renal/GU negative Renal ROS  negative genitourinary   Musculoskeletal   Abdominal   Peds  Hematology negative hematology ROS (+)   Anesthesia Other Findings   Reproductive/Obstetrics negative OB ROS                             Anesthesia Physical Anesthesia Plan  ASA: 2  Anesthesia Plan: General   Post-op Pain Management:    Induction:   PONV Risk Score and Plan: Propofol infusion  Airway Management Planned:   Additional Equipment:   Intra-op Plan:   Post-operative Plan:   Informed Consent: I have reviewed the patients History and Physical, chart, labs and discussed the procedure including the risks, benefits and alternatives for the proposed anesthesia with the patient or authorized representative who has indicated his/her understanding and acceptance.     Dental Advisory Given  Plan Discussed with: CRNA  Anesthesia Plan Comments:         Anesthesia Quick Evaluation  

## 2021-09-24 LAB — SURGICAL PATHOLOGY

## 2021-09-25 ENCOUNTER — Encounter (HOSPITAL_COMMUNITY): Payer: Self-pay | Admitting: Internal Medicine

## 2021-09-30 ENCOUNTER — Other Ambulatory Visit: Payer: Self-pay

## 2021-09-30 DIAGNOSIS — R1319 Other dysphagia: Secondary | ICD-10-CM

## 2021-09-30 DIAGNOSIS — B9681 Helicobacter pylori [H. pylori] as the cause of diseases classified elsewhere: Secondary | ICD-10-CM

## 2021-09-30 DIAGNOSIS — K2970 Gastritis, unspecified, without bleeding: Secondary | ICD-10-CM

## 2021-09-30 MED ORDER — PYLERA 140-125-125 MG PO CAPS: 3.0000 | ORAL_CAPSULE | Freq: Three times a day (TID) | ORAL | 0 refills | Status: DC

## 2021-10-04 ENCOUNTER — Telehealth: Payer: Self-pay | Admitting: Internal Medicine

## 2021-10-04 NOTE — Telephone Encounter (Signed)
Patient called and said that the antibiotic prescription that was called in for him was over 300$ and that was after his 2 insurances paid.  Is there an alternative because he can not afford that amount

## 2021-10-09 ENCOUNTER — Telehealth: Payer: Self-pay | Admitting: Internal Medicine

## 2021-10-09 DIAGNOSIS — A048 Other specified bacterial intestinal infections: Secondary | ICD-10-CM

## 2021-10-09 MED ORDER — AMOXICILL-CLARITHRO-LANSOPRAZ PO MISC: Freq: Two times a day (BID) | ORAL | 0 refills | Status: DC

## 2021-10-09 NOTE — Telephone Encounter (Signed)
Pylera was called in for the patient and it is too expensive for the patient. Is there an alternative for this patient or do we need to try to submit for patient assistance? ?

## 2021-10-09 NOTE — Telephone Encounter (Signed)
I am unsure why Pylera was sent in instead of Prevpac.  I will send in Prevpac to his pharmacy.  Patient to hold omeprazole until he completes full course at which point he can go back on his omeprazole. ?

## 2021-10-30 ENCOUNTER — Encounter: Payer: Self-pay | Admitting: Internal Medicine

## 2021-11-26 DIAGNOSIS — M1A09X0 Idiopathic chronic gout, multiple sites, without tophus (tophi): Secondary | ICD-10-CM | POA: Diagnosis not present

## 2021-11-26 DIAGNOSIS — Z6831 Body mass index (BMI) 31.0-31.9, adult: Secondary | ICD-10-CM | POA: Diagnosis not present

## 2021-11-26 DIAGNOSIS — M0579 Rheumatoid arthritis with rheumatoid factor of multiple sites without organ or systems involvement: Secondary | ICD-10-CM | POA: Diagnosis not present

## 2021-11-26 DIAGNOSIS — M2550 Pain in unspecified joint: Secondary | ICD-10-CM | POA: Diagnosis not present

## 2021-11-26 DIAGNOSIS — E669 Obesity, unspecified: Secondary | ICD-10-CM | POA: Diagnosis not present

## 2021-11-26 DIAGNOSIS — M1991 Primary osteoarthritis, unspecified site: Secondary | ICD-10-CM | POA: Diagnosis not present

## 2022-01-23 ENCOUNTER — Ambulatory Visit: Payer: 59 | Admitting: Gastroenterology

## 2022-02-13 ENCOUNTER — Ambulatory Visit (INDEPENDENT_AMBULATORY_CARE_PROVIDER_SITE_OTHER): Payer: 59 | Admitting: Gastroenterology

## 2022-02-13 ENCOUNTER — Encounter: Payer: Self-pay | Admitting: Gastroenterology

## 2022-02-13 VITALS — BP 140/88 | HR 51 | Temp 97.9°F | Ht 72.0 in | Wt 223.2 lb

## 2022-02-13 DIAGNOSIS — K766 Portal hypertension: Secondary | ICD-10-CM

## 2022-02-13 DIAGNOSIS — R1319 Other dysphagia: Secondary | ICD-10-CM | POA: Diagnosis not present

## 2022-02-13 DIAGNOSIS — B9681 Helicobacter pylori [H. pylori] as the cause of diseases classified elsewhere: Secondary | ICD-10-CM | POA: Diagnosis not present

## 2022-02-13 DIAGNOSIS — K2970 Gastritis, unspecified, without bleeding: Secondary | ICD-10-CM

## 2022-02-13 DIAGNOSIS — K3189 Other diseases of stomach and duodenum: Secondary | ICD-10-CM

## 2022-02-13 NOTE — Progress Notes (Signed)
Gastroenterology Office Note     Primary Care Physician:  Lovey Newcomer, [place]  Primary Gastroenterologist: Dr. Marletta Lor   Chief Complaint   Chief Complaint  Patient presents with   Follow-up    3 month follow up. Pt states nothing is wrong     History of Present Illness   Jerry Page is a 56 y.o. male presenting today in follow-up with a history of dysphagia, s/p EGD in Feb 2023 with dilation of Schatzki ring, gastritis, portal gastropathy. Biopsies positive for H.pylori and took Prevpac.   Dysphagia resolved. No GERD symptoms. On omeprazole daily. No abdominal pain. No other concerns today. He has not had a colonoscopy but interested in pursuing this in September.   He denies any known history of liver disease or elevated LFTs except when taking medications for gout. He states he has been on numerous therapies for RA, including methotrexate.      Past Medical History:  Diagnosis Date   Bradycardia    DDD (degenerative disc disease), cervical    Gout    Hypertension    RA (rheumatoid arthritis) (HCC)    Recurrent nephrolithiasis     Past Surgical History:  Procedure Laterality Date   BALLOON DILATION N/A 09/23/2021   Procedure: BALLOON DILATION;  Surgeon: Lanelle Bal, DO;  Location: [facility];  Service: Endoscopy;  Laterality: N/A;   BIOPSY  09/23/2021   Procedure: BIOPSY;  Surgeon: Lanelle Bal, DO;  Location: [facility];  Service: Endoscopy;;   ESOPHAGOGASTRODUODENOSCOPY (EGD) WITH PROPOFOL N/A 09/23/2021   dilation of Schatzki ring, gastritis, portal gastropathy. Biopsies positive for H.pylori and took Prevpac.   HAND SURGERY Right    LITHOTRIPSY  10/2012    Current Outpatient Medications  Medication Sig Dispense Refill   Febuxostat 80 MG TABS Take 1 tablet by mouth daily.     lisinopril (ZESTRIL) 10 MG tablet Take 10 mg by mouth every evening.     omeprazole (PRILOSEC) 40 MG capsule Take 1 capsule (40 mg total) by mouth daily. 30  capsule 5   rosuvastatin (CRESTOR) 20 MG tablet Take 20 mg by mouth every evening.     No current facility-administered medications for this visit.    Allergies as of 02/13/2022 - Review Complete 02/13/2022  Allergen Reaction Noted   Harriette Ohara [tofacitinib] Other (See Comments) 09/20/2021    Family History  Problem Relation Age of Onset   Colon cancer Neg Hx     Social History   Socioeconomic History   Marital status: Married    Spouse name: Not on file   Number of children: Not on file   Years of education: Not on file   Highest education level: Not on file  Occupational History   Not on file  Tobacco Use   Smoking status: Never   Smokeless tobacco: Never  Substance and Sexual Activity   Alcohol use: Not Currently   Drug use: Not Currently   Sexual activity: Not on file  Other Topics Concern   Not on file  Social History Narrative   Not on file   Social Determinants of Health   Financial Resource Strain: Not on file  Food Insecurity: Not on file  Transportation Needs: Not on file  Physical Activity: Not on file  Stress: Not on file  Social Connections: Not on file  Intimate Partner Violence: Not on file     Review of Systems   Gen: Denies any fever, chills, fatigue, weight loss, lack  of appetite.  CV: Denies chest pain, heart palpitations, peripheral edema, syncope.  Resp: Denies shortness of breath at rest or with exertion. Denies wheezing or cough.  GI: see HPI GU : Denies urinary burning, urinary frequency, urinary hesitancy MS: Denies joint pain, muscle weakness, cramps, or limitation of movement.  Derm: Denies rash, itching, dry skin Psych: Denies depression, anxiety, memory loss, and confusion Heme: Denies bruising, bleeding, and enlarged lymph nodes.   Physical Exam   BP 140/88   Pulse (!) 51   Temp 97.9 F (36.6 C)   Ht 6' (1.829 m)   Wt 223 lb 3.2 oz (101.2 kg)   BMI 30.27 kg/m  General:   Alert and oriented. Pleasant and cooperative.  Well-nourished and well-developed.  Head:  Normocephalic and atraumatic. Eyes:  Without icterus Abdomen:  +BS, soft, non-tender and non-distended. No HSM noted. No guarding or rebound. No masses appreciated.  Rectal:  Deferred  Msk:  Symmetrical without gross deformities. Normal posture. Extremities:  Without edema. Neurologic:  Alert and  oriented x4;  grossly normal neurologically. Skin:  Intact without significant lesions or rashes. Psych:  Alert and cooperative. Normal mood and affect.   Assessment   Jerry Page is a 56 y.o. male presenting today in follow-up with a history of GERD and dysphagia, s/p dilation of Schatzki ring several months ago. +H.pylori gastritis s/p treatment with Prevpac.   Incidentally found to have portal gastropathy on EGD but no known history of liver disease. Will request outside labs from PCP and order [place] elastography.   H.pylori gastritis: check urea breath test after holding PPI X 14 days.   Screening colonoscopy to be done in September: we can triage him at that time.     PLAN    Hold PPI X 14 days then pursue breath test US abdomen complete with elastography Labs from PCP Further recommendations to follow    [name], PhD, ANP-BC [facility]

## 2022-02-13 NOTE — Patient Instructions (Signed)
I have ordered a breath test to be done 2 weeks after stopping omeprazole. During this time, make sure not to take any antibiotics as well or anything with bismuth in it (like Pepto Bismol). The test is fasting. Once it is completed, you can start back to taking omeprazole daily.  We have ordered an ultrasound to look at your liver closer.  Further recommendations to follow!  It was a pleasure to see you today. I want to create trusting relationships with patients to provide genuine, compassionate, and quality care. I value your feedback. If you receive a survey regarding your visit,  I greatly appreciate you taking time to fill this out.   [name], PhD, ANP-BC [facility]

## 2022-02-18 DIAGNOSIS — Z6831 Body mass index (BMI) 31.0-31.9, adult: Secondary | ICD-10-CM | POA: Diagnosis not present

## 2022-02-18 DIAGNOSIS — R030 Elevated blood-pressure reading, without diagnosis of hypertension: Secondary | ICD-10-CM | POA: Diagnosis not present

## 2022-02-18 DIAGNOSIS — R1011 Right upper quadrant pain: Secondary | ICD-10-CM | POA: Diagnosis not present

## 2022-02-18 DIAGNOSIS — R1013 Epigastric pain: Secondary | ICD-10-CM | POA: Diagnosis not present

## 2022-02-18 DIAGNOSIS — R110 Nausea: Secondary | ICD-10-CM | POA: Diagnosis not present

## 2022-02-19 ENCOUNTER — Telehealth: Payer: Self-pay | Admitting: *Deleted

## 2022-02-19 NOTE — Telephone Encounter (Signed)
FYI to Anna. 

## 2022-02-19 NOTE — Telephone Encounter (Signed)
Received call from spouse stating patient had US done at [facility]. Darl Pikes, can you get the records to see if it's what we ordered? thanks

## 2022-02-24 ENCOUNTER — Ambulatory Visit (HOSPITAL_COMMUNITY): Payer: 59

## 2022-03-26 ENCOUNTER — Encounter: Payer: Self-pay | Admitting: *Deleted

## 2022-04-02 DIAGNOSIS — N200 Calculus of kidney: Secondary | ICD-10-CM | POA: Diagnosis not present

## 2022-04-02 DIAGNOSIS — N281 Cyst of kidney, acquired: Secondary | ICD-10-CM | POA: Diagnosis not present

## 2022-04-07 DIAGNOSIS — R1011 Right upper quadrant pain: Secondary | ICD-10-CM | POA: Diagnosis not present

## 2022-04-10 DIAGNOSIS — R1011 Right upper quadrant pain: Secondary | ICD-10-CM | POA: Diagnosis not present

## 2022-05-16 ENCOUNTER — Telehealth: Payer: Self-pay | Admitting: Gastroenterology

## 2022-05-16 DIAGNOSIS — R160 Hepatomegaly, not elsewhere classified: Secondary | ICD-10-CM

## 2022-05-16 NOTE — Telephone Encounter (Signed)
US abdomen complete from July 2023:   Spleen normal. Mild hepatomegaly with diffuse fatty change. No gallstones. Simple renal cysts bilaterally.   Labs from Nov 2022: Trigylcerides markedly elevated at 622, HDL low at 26, Hgb 16.6, Platelets 181, LFTs normal. This is scanned into chart.  I would still recommend an elastography if he is willing.

## 2022-05-19 NOTE — Telephone Encounter (Signed)
Jerry Page, please advise diagnosis thanks?

## 2022-05-19 NOTE — Telephone Encounter (Addendum)
Diagnosis: hepatomegaly, fatty liver. I put the code in and have signed order.

## 2022-05-19 NOTE — Addendum Note (Signed)
Addended by: Annitta Needs on: 05/19/2022 10:22 AM   Modules accepted: Orders

## 2022-05-19 NOTE — Telephone Encounter (Signed)
LMOVM with appt details. 

## 2022-05-19 NOTE — Addendum Note (Signed)
Addended by: Cheron Every on: 05/19/2022 10:04 AM   Modules accepted: Orders

## 2022-05-19 NOTE — Telephone Encounter (Signed)
Phoned and advised the pt of his result note and the pt is willing to have an elastography. Please call pt to schedule for sometime in November (pt is requesting).

## 2022-05-28 ENCOUNTER — Ambulatory Visit (HOSPITAL_COMMUNITY): Payer: 59

## 2022-05-28 DIAGNOSIS — Z6830 Body mass index (BMI) 30.0-30.9, adult: Secondary | ICD-10-CM | POA: Diagnosis not present

## 2022-05-28 DIAGNOSIS — M0579 Rheumatoid arthritis with rheumatoid factor of multiple sites without organ or systems involvement: Secondary | ICD-10-CM | POA: Diagnosis not present

## 2022-05-28 DIAGNOSIS — M79642 Pain in left hand: Secondary | ICD-10-CM | POA: Diagnosis not present

## 2022-05-28 DIAGNOSIS — M1991 Primary osteoarthritis, unspecified site: Secondary | ICD-10-CM | POA: Diagnosis not present

## 2022-05-28 DIAGNOSIS — M2550 Pain in unspecified joint: Secondary | ICD-10-CM | POA: Diagnosis not present

## 2022-05-28 DIAGNOSIS — M79641 Pain in right hand: Secondary | ICD-10-CM | POA: Diagnosis not present

## 2022-05-28 DIAGNOSIS — E669 Obesity, unspecified: Secondary | ICD-10-CM | POA: Diagnosis not present

## 2022-05-28 DIAGNOSIS — M1A09X0 Idiopathic chronic gout, multiple sites, without tophus (tophi): Secondary | ICD-10-CM | POA: Diagnosis not present

## 2022-06-13 ENCOUNTER — Ambulatory Visit (HOSPITAL_COMMUNITY)
Admission: RE | Admit: 2022-06-13 | Discharge: 2022-06-13 | Disposition: A | Discharge status: Home / Self Care | Payer: 59 | Source: Ambulatory Visit | Attending: Gastroenterology | Admitting: Gastroenterology

## 2022-06-13 DIAGNOSIS — R160 Hepatomegaly, not elsewhere classified: Secondary | ICD-10-CM | POA: Insufficient documentation

## 2022-06-13 DIAGNOSIS — K760 Fatty (change of) liver, not elsewhere classified: Secondary | ICD-10-CM | POA: Diagnosis not present

## 2022-06-16 ENCOUNTER — Other Ambulatory Visit: Payer: Self-pay | Admitting: Internal Medicine

## 2022-10-21 ENCOUNTER — Ambulatory Visit: Payer: 59 | Admitting: Gastroenterology

## 2022-10-23 ENCOUNTER — Ambulatory Visit: Payer: 59 | Admitting: Gastroenterology

## 2022-11-28 DIAGNOSIS — M1A09X0 Idiopathic chronic gout, multiple sites, without tophus (tophi): Secondary | ICD-10-CM | POA: Diagnosis not present

## 2022-11-28 DIAGNOSIS — M1991 Primary osteoarthritis, unspecified site: Secondary | ICD-10-CM | POA: Diagnosis not present

## 2022-11-28 DIAGNOSIS — Z111 Encounter for screening for respiratory tuberculosis: Secondary | ICD-10-CM | POA: Diagnosis not present

## 2022-11-28 DIAGNOSIS — M0579 Rheumatoid arthritis with rheumatoid factor of multiple sites without organ or systems involvement: Secondary | ICD-10-CM | POA: Diagnosis not present

## 2022-11-28 DIAGNOSIS — Z6831 Body mass index (BMI) 31.0-31.9, adult: Secondary | ICD-10-CM | POA: Diagnosis not present

## 2022-11-28 DIAGNOSIS — E669 Obesity, unspecified: Secondary | ICD-10-CM | POA: Diagnosis not present

## 2022-11-28 DIAGNOSIS — R5383 Other fatigue: Secondary | ICD-10-CM | POA: Diagnosis not present

## 2022-12-26 DIAGNOSIS — R5383 Other fatigue: Secondary | ICD-10-CM | POA: Diagnosis not present

## 2022-12-26 DIAGNOSIS — E669 Obesity, unspecified: Secondary | ICD-10-CM | POA: Diagnosis not present

## 2022-12-26 DIAGNOSIS — M0579 Rheumatoid arthritis with rheumatoid factor of multiple sites without organ or systems involvement: Secondary | ICD-10-CM | POA: Diagnosis not present

## 2022-12-26 DIAGNOSIS — M1A09X0 Idiopathic chronic gout, multiple sites, without tophus (tophi): Secondary | ICD-10-CM | POA: Diagnosis not present

## 2022-12-26 DIAGNOSIS — Z6830 Body mass index (BMI) 30.0-30.9, adult: Secondary | ICD-10-CM | POA: Diagnosis not present

## 2022-12-26 DIAGNOSIS — M1991 Primary osteoarthritis, unspecified site: Secondary | ICD-10-CM | POA: Diagnosis not present

## 2023-01-28 DIAGNOSIS — M0579 Rheumatoid arthritis with rheumatoid factor of multiple sites without organ or systems involvement: Secondary | ICD-10-CM | POA: Diagnosis not present

## 2023-01-28 DIAGNOSIS — E669 Obesity, unspecified: Secondary | ICD-10-CM | POA: Diagnosis not present

## 2023-01-28 DIAGNOSIS — M1A09X0 Idiopathic chronic gout, multiple sites, without tophus (tophi): Secondary | ICD-10-CM | POA: Diagnosis not present

## 2023-01-28 DIAGNOSIS — M1991 Primary osteoarthritis, unspecified site: Secondary | ICD-10-CM | POA: Diagnosis not present

## 2023-01-28 DIAGNOSIS — M25522 Pain in left elbow: Secondary | ICD-10-CM | POA: Diagnosis not present

## 2023-01-28 DIAGNOSIS — R5383 Other fatigue: Secondary | ICD-10-CM | POA: Diagnosis not present

## 2023-01-28 DIAGNOSIS — Z6830 Body mass index (BMI) 30.0-30.9, adult: Secondary | ICD-10-CM | POA: Diagnosis not present

## 2023-01-28 DIAGNOSIS — Z111 Encounter for screening for respiratory tuberculosis: Secondary | ICD-10-CM | POA: Diagnosis not present

## 2023-04-03 DIAGNOSIS — N200 Calculus of kidney: Secondary | ICD-10-CM | POA: Diagnosis not present

## 2023-04-03 DIAGNOSIS — N281 Cyst of kidney, acquired: Secondary | ICD-10-CM | POA: Diagnosis not present

## 2023-06-28 ENCOUNTER — Other Ambulatory Visit: Payer: Self-pay | Admitting: Gastroenterology

## 2023-07-15 ENCOUNTER — Encounter: Payer: Self-pay | Admitting: *Deleted

## 2023-07-29 DIAGNOSIS — Z111 Encounter for screening for respiratory tuberculosis: Secondary | ICD-10-CM | POA: Diagnosis not present

## 2023-07-29 DIAGNOSIS — M0579 Rheumatoid arthritis with rheumatoid factor of multiple sites without organ or systems involvement: Secondary | ICD-10-CM | POA: Diagnosis not present

## 2023-07-29 DIAGNOSIS — R5383 Other fatigue: Secondary | ICD-10-CM | POA: Diagnosis not present

## 2023-07-29 DIAGNOSIS — E669 Obesity, unspecified: Secondary | ICD-10-CM | POA: Diagnosis not present

## 2023-07-29 DIAGNOSIS — M1991 Primary osteoarthritis, unspecified site: Secondary | ICD-10-CM | POA: Diagnosis not present

## 2023-07-29 DIAGNOSIS — M25522 Pain in left elbow: Secondary | ICD-10-CM | POA: Diagnosis not present

## 2023-07-29 DIAGNOSIS — Z6830 Body mass index (BMI) 30.0-30.9, adult: Secondary | ICD-10-CM | POA: Diagnosis not present

## 2023-07-29 DIAGNOSIS — M1A09X0 Idiopathic chronic gout, multiple sites, without tophus (tophi): Secondary | ICD-10-CM | POA: Diagnosis not present

## 2023-08-03 ENCOUNTER — Other Ambulatory Visit: Payer: Self-pay | Admitting: Gastroenterology

## 2023-11-26 ENCOUNTER — Other Ambulatory Visit: Payer: Self-pay | Admitting: Gastroenterology

## 2024-01-03 ENCOUNTER — Other Ambulatory Visit: Payer: Self-pay | Admitting: Gastroenterology

## 2024-01-13 ENCOUNTER — Encounter (INDEPENDENT_AMBULATORY_CARE_PROVIDER_SITE_OTHER): Payer: Self-pay | Admitting: *Deleted

## 2024-10-12 ENCOUNTER — Encounter (INDEPENDENT_AMBULATORY_CARE_PROVIDER_SITE_OTHER): Payer: Medicare (Managed Care) | Admitting: Ophthalmology
